# Patient Record
Sex: Female | Born: 1935 | Race: White | Hispanic: No | Smoking: Current every day smoker
Health system: Southern US, Community
[De-identification: ages and names within clinical notes are randomized; demographics above are authoritative.]

## PROBLEM LIST (undated history)

## (undated) DIAGNOSIS — E785 Hyperlipidemia, unspecified: Secondary | ICD-10-CM

## (undated) DIAGNOSIS — J449 Chronic obstructive pulmonary disease, unspecified: Secondary | ICD-10-CM

## (undated) DIAGNOSIS — J45909 Unspecified asthma, uncomplicated: Secondary | ICD-10-CM

## (undated) DIAGNOSIS — I10 Essential (primary) hypertension: Secondary | ICD-10-CM

## (undated) HISTORY — PX: ABDOMINAL HYSTERECTOMY: SHX81

---

## 2000-09-09 ENCOUNTER — Encounter: Admission: RE | Admit: 2000-09-09 | Discharge: 2000-09-09 | Payer: Self-pay | Admitting: Emergency Medicine

## 2000-09-10 ENCOUNTER — Ambulatory Visit (HOSPITAL_COMMUNITY): Admission: RE | Admit: 2000-09-10 | Discharge: 2000-09-10 | Payer: Self-pay | Admitting: Emergency Medicine

## 2001-09-02 ENCOUNTER — Encounter: Admission: RE | Admit: 2001-09-02 | Discharge: 2001-09-02 | Payer: Self-pay | Admitting: Emergency Medicine

## 2002-05-19 ENCOUNTER — Inpatient Hospital Stay (HOSPITAL_COMMUNITY): Admission: RE | Admit: 2002-05-19 | Discharge: 2002-05-22 | Payer: Self-pay | Admitting: Neurosurgery

## 2004-01-20 ENCOUNTER — Inpatient Hospital Stay (HOSPITAL_COMMUNITY): Admission: EM | Admit: 2004-01-20 | Discharge: 2004-01-24 | Payer: Self-pay | Admitting: Emergency Medicine

## 2004-01-23 ENCOUNTER — Ambulatory Visit: Payer: Self-pay | Admitting: Physical Medicine & Rehabilitation

## 2011-04-05 DIAGNOSIS — R252 Cramp and spasm: Secondary | ICD-10-CM | POA: Diagnosis not present

## 2011-04-05 DIAGNOSIS — Z23 Encounter for immunization: Secondary | ICD-10-CM | POA: Diagnosis not present

## 2011-04-05 DIAGNOSIS — J449 Chronic obstructive pulmonary disease, unspecified: Secondary | ICD-10-CM | POA: Diagnosis not present

## 2011-04-05 DIAGNOSIS — I10 Essential (primary) hypertension: Secondary | ICD-10-CM | POA: Diagnosis not present

## 2011-04-05 DIAGNOSIS — E782 Mixed hyperlipidemia: Secondary | ICD-10-CM | POA: Diagnosis not present

## 2011-04-05 DIAGNOSIS — F17200 Nicotine dependence, unspecified, uncomplicated: Secondary | ICD-10-CM | POA: Diagnosis not present

## 2011-04-05 DIAGNOSIS — F3342 Major depressive disorder, recurrent, in full remission: Secondary | ICD-10-CM | POA: Diagnosis not present

## 2011-04-05 DIAGNOSIS — E559 Vitamin D deficiency, unspecified: Secondary | ICD-10-CM | POA: Diagnosis not present

## 2011-10-08 DIAGNOSIS — Z79899 Other long term (current) drug therapy: Secondary | ICD-10-CM | POA: Diagnosis not present

## 2011-10-08 DIAGNOSIS — I10 Essential (primary) hypertension: Secondary | ICD-10-CM | POA: Diagnosis not present

## 2011-10-08 DIAGNOSIS — F339 Major depressive disorder, recurrent, unspecified: Secondary | ICD-10-CM | POA: Diagnosis not present

## 2011-10-08 DIAGNOSIS — E782 Mixed hyperlipidemia: Secondary | ICD-10-CM | POA: Diagnosis not present

## 2011-10-08 DIAGNOSIS — E559 Vitamin D deficiency, unspecified: Secondary | ICD-10-CM | POA: Diagnosis not present

## 2011-10-08 DIAGNOSIS — Z23 Encounter for immunization: Secondary | ICD-10-CM | POA: Diagnosis not present

## 2011-10-08 DIAGNOSIS — F17200 Nicotine dependence, unspecified, uncomplicated: Secondary | ICD-10-CM | POA: Diagnosis not present

## 2012-04-06 ENCOUNTER — Other Ambulatory Visit: Payer: Self-pay | Admitting: Family Medicine

## 2012-04-06 DIAGNOSIS — Z13820 Encounter for screening for osteoporosis: Secondary | ICD-10-CM | POA: Diagnosis not present

## 2012-04-06 DIAGNOSIS — J309 Allergic rhinitis, unspecified: Secondary | ICD-10-CM | POA: Diagnosis not present

## 2012-04-06 DIAGNOSIS — J449 Chronic obstructive pulmonary disease, unspecified: Secondary | ICD-10-CM | POA: Diagnosis not present

## 2012-04-06 DIAGNOSIS — Z1231 Encounter for screening mammogram for malignant neoplasm of breast: Secondary | ICD-10-CM

## 2012-04-06 DIAGNOSIS — E559 Vitamin D deficiency, unspecified: Secondary | ICD-10-CM | POA: Diagnosis not present

## 2012-04-06 DIAGNOSIS — E782 Mixed hyperlipidemia: Secondary | ICD-10-CM | POA: Diagnosis not present

## 2012-04-06 DIAGNOSIS — F339 Major depressive disorder, recurrent, unspecified: Secondary | ICD-10-CM | POA: Diagnosis not present

## 2012-04-06 DIAGNOSIS — I10 Essential (primary) hypertension: Secondary | ICD-10-CM | POA: Diagnosis not present

## 2012-04-06 DIAGNOSIS — Z1239 Encounter for other screening for malignant neoplasm of breast: Secondary | ICD-10-CM | POA: Diagnosis not present

## 2012-05-21 ENCOUNTER — Ambulatory Visit
Admission: RE | Admit: 2012-05-21 | Discharge: 2012-05-21 | Disposition: A | Discharge status: Home / Self Care | Payer: Medicare Other | Source: Ambulatory Visit | Attending: Family Medicine | Admitting: Family Medicine

## 2012-05-21 ENCOUNTER — Other Ambulatory Visit: Payer: Self-pay | Admitting: Family Medicine

## 2012-05-21 DIAGNOSIS — Z1231 Encounter for screening mammogram for malignant neoplasm of breast: Secondary | ICD-10-CM

## 2012-05-21 DIAGNOSIS — R928 Other abnormal and inconclusive findings on diagnostic imaging of breast: Secondary | ICD-10-CM

## 2012-06-04 ENCOUNTER — Ambulatory Visit
Admission: RE | Admit: 2012-06-04 | Discharge: 2012-06-04 | Disposition: A | Discharge status: Home / Self Care | Payer: Medicare Other | Source: Ambulatory Visit | Attending: Family Medicine | Admitting: Family Medicine

## 2012-06-04 DIAGNOSIS — R928 Other abnormal and inconclusive findings on diagnostic imaging of breast: Secondary | ICD-10-CM

## 2012-10-08 DIAGNOSIS — I10 Essential (primary) hypertension: Secondary | ICD-10-CM | POA: Diagnosis not present

## 2012-10-08 DIAGNOSIS — J309 Allergic rhinitis, unspecified: Secondary | ICD-10-CM | POA: Diagnosis not present

## 2012-10-08 DIAGNOSIS — M839 Adult osteomalacia, unspecified: Secondary | ICD-10-CM | POA: Diagnosis not present

## 2012-10-08 DIAGNOSIS — E782 Mixed hyperlipidemia: Secondary | ICD-10-CM | POA: Diagnosis not present

## 2012-10-08 DIAGNOSIS — Z23 Encounter for immunization: Secondary | ICD-10-CM | POA: Diagnosis not present

## 2012-10-08 DIAGNOSIS — F17200 Nicotine dependence, unspecified, uncomplicated: Secondary | ICD-10-CM | POA: Diagnosis not present

## 2012-10-08 DIAGNOSIS — F339 Major depressive disorder, recurrent, unspecified: Secondary | ICD-10-CM | POA: Diagnosis not present

## 2012-10-08 DIAGNOSIS — F064 Anxiety disorder due to known physiological condition: Secondary | ICD-10-CM | POA: Diagnosis not present

## 2012-12-29 DIAGNOSIS — L02519 Cutaneous abscess of unspecified hand: Secondary | ICD-10-CM | POA: Diagnosis not present

## 2013-01-01 DIAGNOSIS — L02519 Cutaneous abscess of unspecified hand: Secondary | ICD-10-CM | POA: Diagnosis not present

## 2013-04-23 DIAGNOSIS — I10 Essential (primary) hypertension: Secondary | ICD-10-CM | POA: Diagnosis not present

## 2013-04-23 DIAGNOSIS — Z23 Encounter for immunization: Secondary | ICD-10-CM | POA: Diagnosis not present

## 2013-04-23 DIAGNOSIS — F3342 Major depressive disorder, recurrent, in full remission: Secondary | ICD-10-CM | POA: Diagnosis not present

## 2013-04-23 DIAGNOSIS — E782 Mixed hyperlipidemia: Secondary | ICD-10-CM | POA: Diagnosis not present

## 2013-04-23 DIAGNOSIS — E559 Vitamin D deficiency, unspecified: Secondary | ICD-10-CM | POA: Diagnosis not present

## 2013-04-23 DIAGNOSIS — F064 Anxiety disorder due to known physiological condition: Secondary | ICD-10-CM | POA: Diagnosis not present

## 2013-10-25 DIAGNOSIS — J309 Allergic rhinitis, unspecified: Secondary | ICD-10-CM | POA: Diagnosis not present

## 2013-10-25 DIAGNOSIS — E782 Mixed hyperlipidemia: Secondary | ICD-10-CM | POA: Diagnosis not present

## 2013-10-25 DIAGNOSIS — F339 Major depressive disorder, recurrent, unspecified: Secondary | ICD-10-CM | POA: Diagnosis not present

## 2013-10-25 DIAGNOSIS — Z23 Encounter for immunization: Secondary | ICD-10-CM | POA: Diagnosis not present

## 2013-10-25 DIAGNOSIS — F17200 Nicotine dependence, unspecified, uncomplicated: Secondary | ICD-10-CM | POA: Diagnosis not present

## 2013-10-25 DIAGNOSIS — E559 Vitamin D deficiency, unspecified: Secondary | ICD-10-CM | POA: Diagnosis not present

## 2013-10-25 DIAGNOSIS — I10 Essential (primary) hypertension: Secondary | ICD-10-CM | POA: Diagnosis not present

## 2014-04-26 DIAGNOSIS — E782 Mixed hyperlipidemia: Secondary | ICD-10-CM | POA: Diagnosis not present

## 2014-04-26 DIAGNOSIS — I10 Essential (primary) hypertension: Secondary | ICD-10-CM | POA: Diagnosis not present

## 2014-04-26 DIAGNOSIS — E559 Vitamin D deficiency, unspecified: Secondary | ICD-10-CM | POA: Diagnosis not present

## 2014-07-17 ENCOUNTER — Encounter (HOSPITAL_COMMUNITY): Payer: Self-pay | Admitting: Radiology

## 2014-07-17 ENCOUNTER — Observation Stay (HOSPITAL_COMMUNITY)
Admission: EM | Admit: 2014-07-17 | Discharge: 2014-07-18 | Disposition: A | Discharge status: Home / Self Care | Payer: Medicare Other | Attending: Internal Medicine | Admitting: Internal Medicine

## 2014-07-17 ENCOUNTER — Emergency Department (HOSPITAL_COMMUNITY): Payer: Medicare Other

## 2014-07-17 DIAGNOSIS — Z9841 Cataract extraction status, right eye: Secondary | ICD-10-CM | POA: Insufficient documentation

## 2014-07-17 DIAGNOSIS — E785 Hyperlipidemia, unspecified: Secondary | ICD-10-CM | POA: Insufficient documentation

## 2014-07-17 DIAGNOSIS — Y9389 Activity, other specified: Secondary | ICD-10-CM | POA: Insufficient documentation

## 2014-07-17 DIAGNOSIS — M5030 Other cervical disc degeneration, unspecified cervical region: Secondary | ICD-10-CM | POA: Insufficient documentation

## 2014-07-17 DIAGNOSIS — Y9289 Other specified places as the place of occurrence of the external cause: Secondary | ICD-10-CM | POA: Diagnosis not present

## 2014-07-17 DIAGNOSIS — R9431 Abnormal electrocardiogram [ECG] [EKG]: Secondary | ICD-10-CM | POA: Diagnosis not present

## 2014-07-17 DIAGNOSIS — I6523 Occlusion and stenosis of bilateral carotid arteries: Secondary | ICD-10-CM | POA: Diagnosis not present

## 2014-07-17 DIAGNOSIS — Y998 Other external cause status: Secondary | ICD-10-CM | POA: Diagnosis not present

## 2014-07-17 DIAGNOSIS — I10 Essential (primary) hypertension: Secondary | ICD-10-CM | POA: Insufficient documentation

## 2014-07-17 DIAGNOSIS — R739 Hyperglycemia, unspecified: Secondary | ICD-10-CM

## 2014-07-17 DIAGNOSIS — S0081XA Abrasion of other part of head, initial encounter: Secondary | ICD-10-CM | POA: Diagnosis not present

## 2014-07-17 DIAGNOSIS — T1490XA Injury, unspecified, initial encounter: Secondary | ICD-10-CM

## 2014-07-17 DIAGNOSIS — R55 Syncope and collapse: Principal | ICD-10-CM | POA: Insufficient documentation

## 2014-07-17 DIAGNOSIS — S0180XA Unspecified open wound of other part of head, initial encounter: Secondary | ICD-10-CM | POA: Diagnosis not present

## 2014-07-17 DIAGNOSIS — S0990XA Unspecified injury of head, initial encounter: Secondary | ICD-10-CM | POA: Diagnosis not present

## 2014-07-17 DIAGNOSIS — E860 Dehydration: Secondary | ICD-10-CM | POA: Insufficient documentation

## 2014-07-17 DIAGNOSIS — W19XXXA Unspecified fall, initial encounter: Secondary | ICD-10-CM | POA: Insufficient documentation

## 2014-07-17 DIAGNOSIS — S0101XA Laceration without foreign body of scalp, initial encounter: Secondary | ICD-10-CM | POA: Diagnosis not present

## 2014-07-17 DIAGNOSIS — S199XXA Unspecified injury of neck, initial encounter: Secondary | ICD-10-CM | POA: Diagnosis not present

## 2014-07-17 DIAGNOSIS — F17210 Nicotine dependence, cigarettes, uncomplicated: Secondary | ICD-10-CM | POA: Insufficient documentation

## 2014-07-17 DIAGNOSIS — Z9842 Cataract extraction status, left eye: Secondary | ICD-10-CM | POA: Insufficient documentation

## 2014-07-17 DIAGNOSIS — M4802 Spinal stenosis, cervical region: Secondary | ICD-10-CM | POA: Diagnosis not present

## 2014-07-17 HISTORY — DX: Essential (primary) hypertension: I10

## 2014-07-17 HISTORY — DX: Hyperlipidemia, unspecified: E78.5

## 2014-07-17 LAB — TROPONIN I: Troponin I: <0.03 ng/mL (ref <0.031)

## 2014-07-17 LAB — I-STAT CHEM 8, ED
BUN: 28 mg/dL — AB (ref 6–20)
Calcium, Ion: 1.15 mmol/L (ref 1.13–1.30)
Chloride: 101 mmol/L (ref 101–111)
Creatinine, Ser: 1 mg/dL (ref 0.44–1.00)
Glucose, Bld: 117 mg/dL — ABNORMAL HIGH (ref 65–99)
HCT: 39 % (ref 36.0–46.0)
Hemoglobin: 13.3 g/dL (ref 12.0–15.0)
Potassium: 4 mmol/L (ref 3.5–5.1)
SODIUM: 137 mmol/L (ref 135–145)
TCO2: 24 mmol/L (ref 0–100)

## 2014-07-17 LAB — CBC
HCT: 37.7 % (ref 36.0–46.0)
Hemoglobin: 12 g/dL (ref 12.0–15.0)
MCH: 29.5 pg (ref 26.0–34.0)
MCHC: 31.8 g/dL (ref 30.0–36.0)
MCV: 92.6 fL (ref 78.0–100.0)
Platelets: 288 10*3/uL (ref 150–400)
RBC: 4.07 MIL/uL (ref 3.87–5.11)
RDW: 14.2 % (ref 11.5–15.5)
WBC: 7.2 10*3/uL (ref 4.0–10.5)

## 2014-07-17 LAB — I-STAT TROPONIN, ED: Troponin i, poc: 0 ng/mL (ref 0.00–0.08)

## 2014-07-17 LAB — TSH: TSH: 1.724 u[IU]/mL (ref 0.350–4.500)

## 2014-07-17 MED ORDER — SODIUM CHLORIDE 0.9 % IJ SOLN
3.0000 mL | Freq: Two times a day (BID) | INTRAMUSCULAR | Status: DC
  Administered 2014-07-17: 3 mL via INTRAVENOUS

## 2014-07-17 MED ORDER — SIMVASTATIN 20 MG PO TABS
20.0000 mg | ORAL_TABLET | Freq: Every day | ORAL | Status: DC
  Administered 2014-07-17 – 2014-07-18 (×2): 20 mg via ORAL
  Filled 2014-07-17 (×2): qty 1

## 2014-07-17 MED ORDER — ONDANSETRON HCL 4 MG/2ML IJ SOLN: 4.0000 mg | Freq: Three times a day (TID) | INTRAMUSCULAR | Status: AC | PRN

## 2014-07-17 MED ORDER — VITAMIN D 1000 UNITS PO TABS
1000.0000 [IU] | ORAL_TABLET | Freq: Every day | ORAL | Status: DC
  Administered 2014-07-18: 1000 [IU] via ORAL
  Filled 2014-07-17: qty 1

## 2014-07-17 MED ORDER — SODIUM CHLORIDE 0.9 % IV SOLN
INTRAVENOUS | Status: AC
  Administered 2014-07-17: 22:00:00 via INTRAVENOUS

## 2014-07-17 MED ORDER — MIRTAZAPINE 15 MG PO TBDP
15.0000 mg | ORAL_TABLET | Freq: Every day | ORAL | Status: DC
  Administered 2014-07-17: 15 mg via ORAL
  Filled 2014-07-17 (×2): qty 1

## 2014-07-17 MED ORDER — MAGNESIUM 200 MG PO TABS
1.0000 | ORAL_TABLET | Freq: Every day | ORAL | Status: DC
  Administered 2014-07-17: 200 mg via ORAL
  Filled 2014-07-17 (×3): qty 1

## 2014-07-17 MED ORDER — ACETAMINOPHEN 325 MG PO TABS: 650.0000 mg | ORAL_TABLET | Freq: Four times a day (QID) | ORAL | Status: DC | PRN

## 2014-07-17 MED ORDER — TAB-A-VITE/IRON PO TABS
1.0000 | ORAL_TABLET | Freq: Every day | ORAL | Status: DC
  Administered 2014-07-18: 1 via ORAL
  Filled 2014-07-17 (×2): qty 1

## 2014-07-17 MED ORDER — LISINOPRIL 10 MG PO TABS: 10.0000 mg | ORAL_TABLET | Freq: Every day | ORAL | Status: DC

## 2014-07-17 MED ORDER — ACETAMINOPHEN 650 MG RE SUPP: 650.0000 mg | Freq: Four times a day (QID) | RECTAL | Status: DC | PRN

## 2014-07-17 NOTE — ED Notes (Signed)
Bed: [facility] Expected date:  Expected time:  Means of arrival:  Comments: EMS fall 

## 2014-07-17 NOTE — H&P (Signed)
Ashlee Cobb is an 81 y.o. female.    Orie Rout (pcp, Tamela Oddi)  Chief Complaint: syncope HPI: 81 yo female with htn, hyperlipidemia, apparently was apparently just finishing brushing her teeth and had syncope.  No presyncopal symptoms.  Out 2-3 minutes.  Pt had history of black out, and used to take medication for it but last was in 2006.  Pt denies cp, palp, sob, n/v, focal neurological symptoms, seizure.  Pt was seen in ED and to be admitted for syncope.   Past Medical History  Diagnosis Date  . Hyperlipidemia   . Hypertension     Past Surgical History  Procedure Laterality Date  . Abdominal hysterectomy      Family History  Problem Relation Age of Onset  . Cirrhosis Father    Social History:  reports that she has been smoking Cigarettes.  She has a 60 pack-year smoking history. She does not have any smokeless tobacco history on file. She reports that she drinks about 0.6 oz of alcohol per week. Her drug history is not on file.  Allergies: No Known Allergies Medications reviewed   Results for orders placed or performed during the hospital encounter of 07/17/14 (from the past 48 hour(s))  CBC     Status: None   Collection Time: 07/17/14  5:52 PM  Result Value Ref Range   WBC 7.2 4.0 - 10.5 K/uL   RBC 4.07 3.87 - 5.11 MIL/uL   Hemoglobin 12.0 12.0 - 15.0 g/dL   HCT 79.3 90.3 - 00.9 %   MCV 92.6 78.0 - 100.0 fL   MCH 29.5 26.0 - 34.0 pg   MCHC 31.8 30.0 - 36.0 g/dL   RDW 23.3 00.7 - 62.2 %   Platelets 288 150 - 400 K/uL  I-stat chem 8, ed     Status: Abnormal   Collection Time: 07/17/14  5:58 PM  Result Value Ref Range   Sodium 137 135 - 145 mmol/L   Potassium 4.0 3.5 - 5.1 mmol/L   Chloride 101 101 - 111 mmol/L   BUN 28 (H) 6 - 20 mg/dL   Creatinine, Ser 6.33 0.44 - 1.00 mg/dL   Glucose, Bld 354 (H) 65 - 99 mg/dL   Calcium, Ion 5.62 5.63 - 1.30 mmol/L   TCO2 24 0 - 100 mmol/L   Hemoglobin 13.3 12.0 - 15.0 g/dL   HCT 89.3 73.4 - 28.7 %  I-stat  troponin, ED     Status: None   Collection Time: 07/17/14  6:16 PM  Result Value Ref Range   Troponin i, poc 0.00 0.00 - 0.08 ng/mL   Comment 3            Comment: Due to the release kinetics of cTnI, a negative result within the first hours of the onset of symptoms does not rule out myocardial infarction with certainty. If myocardial infarction is still suspected, repeat the test at appropriate intervals.    Ct Head Wo Contrast  07/17/2014   ADDENDUM REPORT: 07/17/2014 18:46  ADDENDUM: Left parietal scalp soft tissue injury.   Electronically Signed   By: Sebastian Ache   On: 07/17/2014 18:46   07/17/2014   CLINICAL DATA:  Fall today with abrasion on the back of the head. No loss of consciousness. Initial encounter.  EXAM: CT HEAD WITHOUT CONTRAST  CT CERVICAL SPINE WITHOUT CONTRAST  TECHNIQUE: Multidetector CT imaging of the head and cervical spine was performed following the standard protocol without intravenous contrast. Multiplanar CT image reconstructions  of the cervical spine were also generated.  COMPARISON:  None.  FINDINGS: CT HEAD FINDINGS  Mild generalized cerebral atrophy is within normal limits for age. Periventricular white-matter hypodensities are nonspecific but compatible with mild chronic small vessel ischemic disease. There is no evidence of acute cortical infarct, intracranial hemorrhage, mass, midline shift, or extra-axial fluid collection.  Prior bilateral cataract extraction is noted. The there is focal scalp soft tissue swelling with a small locular gas noted in the left parietal scalp. The mastoid air cells and paranasal sinuses are clear. No skull fracture is identified.  CT CERVICAL SPINE FINDINGS  There is slight anterolisthesis of C4 on C5, likely facet mediated. Mild, focal deformity of the anterior superior C5 vertebral body does not appear acute, possibly reflecting remote injury. No acute cervical spine fracture is identified.  Moderate disc space narrowing and  degenerative endplate spurring are present at C5-6 and C6-7. Partially calcified disc protrusions at C5-6 and C6-7 result in likely moderate spinal stenosis at both levels. Asymmetric, advanced facet arthrosis is present on the right at C3-4 and C4-5 resulting in moderate right-sided neural foraminal stenosis. Uncovertebral spurring results in moderate to severe neural foraminal stenosis bilaterally at C5-6 and C6-7. Mild scarring is noted in the lung apices. Advanced atherosclerotic vascular calcification is noted.  IMPRESSION: 1. No evidence of acute intracranial abnormality. 2. No acute osseous abnormality identified in the cervical spine. 3. Cervical disc degeneration, greatest at C5-6 and C6-7 where there is moderate spinal stenosis and bilateral neural foraminal stenosis.  Electronically Signed: By: Sebastian Ache On: 07/17/2014 18:38   Ct Cervical Spine Wo Contrast  07/17/2014   ADDENDUM REPORT: 07/17/2014 18:46  ADDENDUM: Left parietal scalp soft tissue injury.   Electronically Signed   By: Sebastian Ache   On: 07/17/2014 18:46   07/17/2014   CLINICAL DATA:  Fall today with abrasion on the back of the head. No loss of consciousness. Initial encounter.  EXAM: CT HEAD WITHOUT CONTRAST  CT CERVICAL SPINE WITHOUT CONTRAST  TECHNIQUE: Multidetector CT imaging of the head and cervical spine was performed following the standard protocol without intravenous contrast. Multiplanar CT image reconstructions of the cervical spine were also generated.  COMPARISON:  None.  FINDINGS: CT HEAD FINDINGS  Mild generalized cerebral atrophy is within normal limits for age. Periventricular white-matter hypodensities are nonspecific but compatible with mild chronic small vessel ischemic disease. There is no evidence of acute cortical infarct, intracranial hemorrhage, mass, midline shift, or extra-axial fluid collection.  Prior bilateral cataract extraction is noted. The there is focal scalp soft tissue swelling with a small locular  gas noted in the left parietal scalp. The mastoid air cells and paranasal sinuses are clear. No skull fracture is identified.  CT CERVICAL SPINE FINDINGS  There is slight anterolisthesis of C4 on C5, likely facet mediated. Mild, focal deformity of the anterior superior C5 vertebral body does not appear acute, possibly reflecting remote injury. No acute cervical spine fracture is identified.  Moderate disc space narrowing and degenerative endplate spurring are present at C5-6 and C6-7. Partially calcified disc protrusions at C5-6 and C6-7 result in likely moderate spinal stenosis at both levels. Asymmetric, advanced facet arthrosis is present on the right at C3-4 and C4-5 resulting in moderate right-sided neural foraminal stenosis. Uncovertebral spurring results in moderate to severe neural foraminal stenosis bilaterally at C5-6 and C6-7. Mild scarring is noted in the lung apices. Advanced atherosclerotic vascular calcification is noted.  IMPRESSION: 1. No evidence of acute intracranial abnormality.  2. No acute osseous abnormality identified in the cervical spine. 3. Cervical disc degeneration, greatest at C5-6 and C6-7 where there is moderate spinal stenosis and bilateral neural foraminal stenosis.  Electronically Signed: By: Sebastian Ache On: 07/17/2014 18:38    Review of Systems  Constitutional: Negative.   HENT: Negative.   Eyes: Negative.   Respiratory: Negative.   Cardiovascular: Negative.   Gastrointestinal: Negative.   Genitourinary: Negative.   Musculoskeletal: Negative.   Skin: Negative.   Neurological: Positive for loss of consciousness. Negative for dizziness, tingling, tremors, sensory change, speech change, focal weakness and seizures.  Endo/Heme/Allergies: Negative.   Psychiatric/Behavioral: Negative.     Blood pressure 102/65, pulse 96, temperature 98.1 F (36.7 C), temperature source Oral, resp. rate 18, SpO2 97 %. Physical Exam  Constitutional: She is oriented to person, place,  and time. She appears well-developed and well-nourished.  HENT:  Head: Normocephalic and atraumatic.  Eyes: Conjunctivae and EOM are normal. Pupils are equal, round, and reactive to light. No scleral icterus.  Neck: Normal range of motion. Neck supple. No JVD present. No tracheal deviation present. No thyromegaly present.  Cardiovascular: Normal rate and regular rhythm.  Exam reveals no gallop and no friction rub.   No murmur heard. Respiratory: Effort normal and breath sounds normal. No respiratory distress. She has no wheezes. She has no rales.  GI: Soft. Bowel sounds are normal. She exhibits no distension. There is no tenderness. There is no rebound and no guarding.  Musculoskeletal: Normal range of motion. She exhibits no edema or tenderness.  Lymphadenopathy:    She has no cervical adenopathy.  Neurological: She is alert and oriented to person, place, and time. She has normal reflexes. She displays normal reflexes. No cranial nerve deficit. She exhibits normal muscle tone. Coordination normal.  Skin: Skin is warm and dry. No rash noted. No erythema. No pallor.  Psychiatric: She has a normal mood and affect. Her behavior is normal. Judgment and thought content normal.     Assessment/Plan Syncope Tele Trop i q6h x3 Check tsh Check orthostatic bp Check carotid u/s cardiac echo Please expand database in the am to find out what medication she was on in the past for syncope  Hyperglycemia Check hga1c  Dehydration Hydration with saline  Laceration of the scalp,  Pt will need to have staples removed in 7-10 days.   DVT prophylaxis:  Scd Check cbc, cmp in am   Pearson Grippe 07/17/2014, 7:39 PM

## 2014-07-17 NOTE — Progress Notes (Signed)
Patient with laceration on scalp. Patient still with minimal bleeding from laceration. Patient denies headache or pain, just soreness. Will continue to monitor patient closely.

## 2014-07-17 NOTE — ED Provider Notes (Signed)
CSN: [id number]     Arrival date & time 07/17/14  1711 History   First MD Initiated Contact with Patient 07/17/14 1726     Chief Complaint  Patient presents with  . Fall  . possible syncopal episode      (Consider location/radiation/quality/duration/timing/severity/associated sxs/prior Treatment) HPI Comments: The patient is an 81 year old female, she currently lives at [facility] where she was in her bathroom, she denies any prodromal symptoms including chest pain, palpitations, shortness of breath and she denies any fevers chills nausea vomiting diarrhea dysuria swelling rashes weakness numbness or change in vision. She does not have a headache, she awoke on the floor of her bathroom after she was trying to brush her teeth, she had a small laceration, she was able to get up and walk, she pulled the cord for assistance, she then sat in a chair and waited for help. At this time she feels she is at her baseline, she denies taking any blood thinners, she denies having syncopal episodes in the past. At this time she feels fine and has no complaints.  This was acute in onset, it occurred just prior to arrival, paramedics transported the patient, no abnormal vital signs. She does state that she was able to eat both breakfast and lunch today without difficulty.  Patient is a 81 y.o. female presenting with fall. The history is provided by the patient.  Fall    History reviewed. No pertinent past medical history. No past surgical history on file. No family history on file. History  Substance Use Topics  . Smoking status: Not on file  . Smokeless tobacco: Not on file  . Alcohol Use: Not on file   OB History    No data available     Review of Systems  All other systems reviewed and are negative.     Allergies  Review of patient's allergies indicates not on file.  Home Medications   Prior to Admission medications   Medication Sig Start Date End Date Taking?  Authorizing Provider  cholecalciferol (VITAMIN D) 1000 UNITS tablet Take 1,000 Units by mouth daily.   Yes Historical Provider, MD  fexofenadine (ALLEGRA) 180 MG tablet Take 180 mg by mouth daily as needed for allergies or rhinitis.   Yes Historical Provider, MD  lisinopril (PRINIVIL,ZESTRIL) 10 MG tablet Take 10 mg by mouth daily.   Yes Historical Provider, MD  Magnesium 250 MG TABS Take 1 tablet by mouth daily at 8 pm. At 5 pm to help with night time cramps   Yes Historical Provider, MD  mirtazapine (REMERON SOL-TAB) 15 MG disintegrating tablet DISSOLVE 1 TAB BY MOUTH DAILY AT 8PM 06/08/14  Yes Historical Provider, MD  Multiple Vitamins-Iron (MULTIVITAMINS WITH IRON) TABS tablet Take 1 tablet by mouth daily.   Yes Historical Provider, MD  simvastatin (ZOCOR) 20 MG tablet Take 20 mg by mouth daily.   Yes Historical Provider, MD   BP 102/65 mmHg  Pulse 96  Temp(Src) 98.1 F (36.7 C) (Oral)  Resp 18  SpO2 97% Physical Exam  Constitutional: She appears well-developed and well-nourished. No distress.  HENT:  Head: Normocephalic.  Mouth/Throat: Oropharynx is clear and moist. No oropharyngeal exudate.  No hemotympanum, no raccoon eyes, no battle sign, no malocclusion, there is a 2 cm laceration to the crown of the head just left of midline  Eyes: Conjunctivae and EOM are normal. Pupils are equal, round, and reactive to light. Right eye exhibits no discharge. Left eye exhibits no discharge. No  scleral icterus.  Neck: Normal range of motion. Neck supple. No JVD present. No thyromegaly present.  No tenderness over the cervical spine  Cardiovascular: Normal rate, regular rhythm, normal heart sounds and intact distal pulses.  Exam reveals no gallop and no friction rub.   No murmur heard. Pulmonary/Chest: Effort normal and breath sounds normal. No respiratory distress. She has no wheezes. She has no rales.  Abdominal: Soft. Bowel sounds are normal. She exhibits no distension and no mass. There is no  tenderness.  Musculoskeletal: Normal range of motion. She exhibits no edema or tenderness.  Lymphadenopathy:    She has no cervical adenopathy.  Neurological: She is alert. Coordination normal.  Normal speech and coordination, normal strength all 4 extremities, able to straight leg raise bilaterally without difficulty.  Skin: Skin is warm and dry. No rash noted. No erythema.  Psychiatric: She has a normal mood and affect. Her behavior is normal.  Nursing note and vitals reviewed.   ED Course  Procedures (including critical care time) Labs Review Labs Reviewed  I-STAT CHEM 8, ED - Abnormal; Notable for the following:    BUN 28 (*)    Glucose, Bld 117 (*)    All other components within normal limits  CBC  I-STAT TROPOININ, ED    Imaging Review Ct Head Wo Contrast  07/17/2014   ADDENDUM REPORT: 07/17/2014 18:46  ADDENDUM: Left parietal scalp soft tissue injury.   Electronically Signed   By: Sebastian Ache   On: 07/17/2014 18:46   07/17/2014   CLINICAL DATA:  Fall today with abrasion on the back of the head. No loss of consciousness. Initial encounter.  EXAM: CT HEAD WITHOUT CONTRAST  CT CERVICAL SPINE WITHOUT CONTRAST  TECHNIQUE: Multidetector CT imaging of the head and cervical spine was performed following the standard protocol without intravenous contrast. Multiplanar CT image reconstructions of the cervical spine were also generated.  COMPARISON:  None.  FINDINGS: CT HEAD FINDINGS  Mild generalized cerebral atrophy is within normal limits for age. Periventricular white-matter hypodensities are nonspecific but compatible with mild chronic small vessel ischemic disease. There is no evidence of acute cortical infarct, intracranial hemorrhage, mass, midline shift, or extra-axial fluid collection.  Prior bilateral cataract extraction is noted. The there is focal scalp soft tissue swelling with a small locular gas noted in the left parietal scalp. The mastoid air cells and paranasal sinuses are  clear. No skull fracture is identified.  CT CERVICAL SPINE FINDINGS  There is slight anterolisthesis of C4 on C5, likely facet mediated. Mild, focal deformity of the anterior superior C5 vertebral body does not appear acute, possibly reflecting remote injury. No acute cervical spine fracture is identified.  Moderate disc space narrowing and degenerative endplate spurring are present at C5-6 and C6-7. Partially calcified disc protrusions at C5-6 and C6-7 result in likely moderate spinal stenosis at both levels. Asymmetric, advanced facet arthrosis is present on the right at C3-4 and C4-5 resulting in moderate right-sided neural foraminal stenosis. Uncovertebral spurring results in moderate to severe neural foraminal stenosis bilaterally at C5-6 and C6-7. Mild scarring is noted in the lung apices. Advanced atherosclerotic vascular calcification is noted.  IMPRESSION: 1. No evidence of acute intracranial abnormality. 2. No acute osseous abnormality identified in the cervical spine. 3. Cervical disc degeneration, greatest at C5-6 and C6-7 where there is moderate spinal stenosis and bilateral neural foraminal stenosis.  Electronically Signed: By: Sebastian Ache On: 07/17/2014 18:38   Ct Cervical Spine Wo Contrast  07/17/2014  ADDENDUM REPORT: 07/17/2014 18:46  ADDENDUM: Left parietal scalp soft tissue injury.   Electronically Signed   By: Sebastian Ache   On: 07/17/2014 18:46   07/17/2014   CLINICAL DATA:  Fall today with abrasion on the back of the head. No loss of consciousness. Initial encounter.  EXAM: CT HEAD WITHOUT CONTRAST  CT CERVICAL SPINE WITHOUT CONTRAST  TECHNIQUE: Multidetector CT imaging of the head and cervical spine was performed following the standard protocol without intravenous contrast. Multiplanar CT image reconstructions of the cervical spine were also generated.  COMPARISON:  None.  FINDINGS: CT HEAD FINDINGS  Mild generalized cerebral atrophy is within normal limits for age. Periventricular  white-matter hypodensities are nonspecific but compatible with mild chronic small vessel ischemic disease. There is no evidence of acute cortical infarct, intracranial hemorrhage, mass, midline shift, or extra-axial fluid collection.  Prior bilateral cataract extraction is noted. The there is focal scalp soft tissue swelling with a small locular gas noted in the left parietal scalp. The mastoid air cells and paranasal sinuses are clear. No skull fracture is identified.  CT CERVICAL SPINE FINDINGS  There is slight anterolisthesis of C4 on C5, likely facet mediated. Mild, focal deformity of the anterior superior C5 vertebral body does not appear acute, possibly reflecting remote injury. No acute cervical spine fracture is identified.  Moderate disc space narrowing and degenerative endplate spurring are present at C5-6 and C6-7. Partially calcified disc protrusions at C5-6 and C6-7 result in likely moderate spinal stenosis at both levels. Asymmetric, advanced facet arthrosis is present on the right at C3-4 and C4-5 resulting in moderate right-sided neural foraminal stenosis. Uncovertebral spurring results in moderate to severe neural foraminal stenosis bilaterally at C5-6 and C6-7. Mild scarring is noted in the lung apices. Advanced atherosclerotic vascular calcification is noted.  IMPRESSION: 1. No evidence of acute intracranial abnormality. 2. No acute osseous abnormality identified in the cervical spine. 3. Cervical disc degeneration, greatest at C5-6 and C6-7 where there is moderate spinal stenosis and bilateral neural foraminal stenosis.  Electronically Signed: By: Sebastian Ache On: 07/17/2014 18:38     EKG Interpretation   Date/Time:  Sunday July 17 2014 17:47:17 EDT Ventricular Rate:  96 PR Interval:  202 QRS Duration: 81 QT Interval:  365 QTC Calculation: 461 R Axis:   68 Text Interpretation:  Sinus rhythm Ventricular premature complex Minimal  ST depression, diffuse leads Since last tracing ST  depressions are new  Abnormal ekg Confirmed by Braycen Burandt  MD, Ryliee Figge ([id number]) on 07/17/2014 6:02:42  PM      MDM   Final diagnoses:  Trauma  Laceration of scalp, initial encounter  Syncope, unspecified syncope type  Abnormal ECG    Vital signs are normal, she has a slightly low blood pressure, we'll check orthostatic vital signs, EKG, rule out anemia, [place] repair scalp.  CT neg for acute findings,  Lac repaired with staples No orthostatic changes ECG changes noted Labs without acute findings - will admit for observation.  LACERATION REPAIR Performed by: Vida Roller Authorized by: Vida Roller Consent: Verbal consent obtained. Risks and benefits: risks, benefits and alternatives were discussed Consent given by: patient Patient identity confirmed: provided demographic data Prepped and Draped in normal sterile fashion Wound explored  Laceration Location: Scalp  Laceration Length: 2 cm  No Foreign Bodies seen or palpated  Anesthesia: local infiltration  Local anesthetic: none  Irrigation method: syringe - 2 L of sterile saline Amount of cleaning: standard  Skin closure: staples  Number of  sutures: 2  Technique: Staplies.  Patient tolerance: Patient tolerated the procedure well with no immediate complications.   Due to the syncope with the abnormal EKG she will need to be admitted to the hospital for observation. We'll discuss with the hospitalist  Discussed with Dr. Selena Batten, he will see the patient emergency department as consultation for admission to the hospital.  Eber Hong, MD 07/17/14 1905

## 2014-07-18 ENCOUNTER — Observation Stay (HOSPITAL_COMMUNITY): Payer: Medicare Other

## 2014-07-18 ENCOUNTER — Observation Stay (HOSPITAL_BASED_OUTPATIENT_CLINIC_OR_DEPARTMENT_OTHER): Payer: Medicare Other

## 2014-07-18 DIAGNOSIS — R55 Syncope and collapse: Secondary | ICD-10-CM

## 2014-07-18 LAB — COMPREHENSIVE METABOLIC PANEL
ALBUMIN: 3.4 g/dL — AB (ref 3.5–5.0)
ALT: 14 U/L (ref 14–54)
ANION GAP: 7 (ref 5–15)
AST: 23 U/L (ref 15–41)
Alkaline Phosphatase: 52 U/L (ref 38–126)
BUN: 18 mg/dL (ref 6–20)
CALCIUM: 8.7 mg/dL — AB (ref 8.9–10.3)
CHLORIDE: 108 mmol/L (ref 101–111)
CO2: 25 mmol/L (ref 22–32)
CREATININE: 0.82 mg/dL (ref 0.44–1.00)
GFR calc Af Amer: >60 mL/min (ref >60)
GFR calc non Af Amer: >60 mL/min (ref >60)
Glucose, Bld: 105 mg/dL — ABNORMAL HIGH (ref 65–99)
Potassium: 4 mmol/L (ref 3.5–5.1)
Sodium: 140 mmol/L (ref 135–145)
TOTAL PROTEIN: 6.3 g/dL — AB (ref 6.5–8.1)
Total Bilirubin: 0.4 mg/dL (ref 0.3–1.2)

## 2014-07-18 LAB — TROPONIN I: Troponin I: <0.03 ng/mL (ref <0.031)

## 2014-07-18 LAB — CBC
HEMATOCRIT: 34.1 % — AB (ref 36.0–46.0)
Hemoglobin: 11.1 g/dL — ABNORMAL LOW (ref 12.0–15.0)
MCH: 30.1 pg (ref 26.0–34.0)
MCHC: 32.6 g/dL (ref 30.0–36.0)
MCV: 92.4 fL (ref 78.0–100.0)
PLATELETS: 261 10*3/uL (ref 150–400)
RBC: 3.69 MIL/uL — ABNORMAL LOW (ref 3.87–5.11)
RDW: 14.1 % (ref 11.5–15.5)
WBC: 6.8 10*3/uL (ref 4.0–10.5)

## 2014-07-18 LAB — MRSA PCR SCREENING: MRSA by PCR: NEGATIVE

## 2014-07-18 MED ORDER — SODIUM CHLORIDE 0.9 % IV BOLUS (SEPSIS)
500.0000 mL | Freq: Once | INTRAVENOUS | Status: AC
  Administered 2014-07-18: 500 mL via INTRAVENOUS

## 2014-07-18 NOTE — Progress Notes (Signed)
  Echocardiogram 2D Echocardiogram has been performed.  Cathie Beams 07/18/2014, 12:46 PM

## 2014-07-18 NOTE — Progress Notes (Signed)
VASCULAR LAB PRELIMINARY  PRELIMINARY  PRELIMINARY  PRELIMINARY  Carotid duplex completed.    Preliminary report: Bilateral:  1-39% ICA stenosis right greater than left. Vertebral artery flow is retrograde on the right and antegrade on the left. Bilateral ECA stenosis left greater than right.     Mattalynn Crandle, RVS 07/18/2014, 11:28 AM

## 2014-07-18 NOTE — Evaluation (Signed)
Physical Therapy Evaluation Patient Details Name: Ashlee Cobb MRN: [id number] DOB: Jun 22, 1923 Today's Date: 07/18/2014   History of Present Illness  81 yo female admitted with syncope and collapse, scalp laceration. Pt is from an ALF  Clinical Impression  On eval, pt was able to walk ~450 feet with intermittent use of hallway handrail. Unsteady at times. LOB x 1 while walking backwards during balance assessment. Pt denied lightheadedness/dizziness during session. Encouraged pt to have ALF staff assist her with ADLs initially to ensure safety. Also recommended to pt that she use cane while ambulating for improved stability (pt reports she already has RW and cane in her room at facility). Recommend HHPT for general strength and balance training at ALF.     Follow Up Recommendations Home health PT (at ALF)    Equipment Recommendations  None recommended by PT (pt reports having RW and cane in her room. Instructed pt to use cane for now. )    Recommendations for Other Services       Precautions / Restrictions Precautions Precautions: Fall Restrictions Weight Bearing Restrictions: No      Mobility  Bed Mobility Overal bed mobility: Modified Independent                Transfers Overall transfer level: Needs assistance   Transfers: Sit to/from Stand Sit to Stand: Min guard         General transfer comment: close guard for safety  Ambulation/Gait Ambulation/Gait assistance: Min assist Ambulation Distance (Feet): 450 Feet Assistive device: None (intermittent handrail use) Gait Pattern/deviations: Wide base of support;Trunk flexed;Step-through pattern     General Gait Details: intermittent assist needed to stabilize. unsteady at times, especially with backwards steps.   Stairs            Wheelchair Mobility    Modified Rankin (Stroke Patients Only)       Balance Overall balance assessment: History of Falls;Needs assistance         Standing balance  support: No upper extremity supported;During functional activity Standing balance-Leahy Scale: Good Standing balance comment: static standing with EO/EC, narrow BOS, withstanding of external perturbations-Min guard assist. 360 degree turn, picking up an object-Min guard assist. Step to task-1 HHA for pt to complete safely             High level balance activites: Side stepping;Backward walking;Direction changes;Turns;Head turns High Level Balance Comments: noted some difficulty with walking while turning head and LOB with backwards walking             Pertinent Vitals/Pain Pain Assessment: Faces Faces Pain Scale: Hurts a little bit Pain Location: head Pain Descriptors / Indicators: Sore    Home Living Family/patient expects to be discharged to:: Assisted living               Home Equipment: Walker - 2 wheels;Cane - single point      Prior Function Level of Independence: Independent         Comments: per pt, not receiving any assistance however it is available if she needs it     Hand Dominance        Extremity/Trunk Assessment   Upper Extremity Assessment: Overall WFL for tasks assessed           Lower Extremity Assessment: Generalized weakness      Cervical / Trunk Assessment: Normal  Communication   Communication: No difficulties  Cognition Arousal/Alertness: Awake/alert Behavior During Therapy: WFL for tasks assessed/performed Overall Cognitive Status: Within Functional Limits for  tasks assessed                      General Comments      Exercises        Assessment/Plan    PT Assessment All further PT needs can be met in the next venue of care (HHPT at ALF)  PT Diagnosis Difficulty walking   PT Problem List Decreased mobility;Decreased balance  PT Treatment Interventions     PT Goals (Current goals can be found in the Care Plan section) Acute Rehab PT Goals Patient Stated Goal: home soon PT Goal Formulation: With  patient Time For Goal Achievement: 08/01/14 Potential to Achieve Goals: Good    Frequency     Barriers to discharge        Co-evaluation               End of Session Equipment Utilized During Treatment: Gait belt Activity Tolerance: Patient tolerated treatment well Patient left: in chair;with call bell/phone within reach      Functional Assessment Tool Used: clinical judgement Functional Limitation: Mobility: Walking and moving around Mobility: Walking and Moving Around Current Status (L2957): At least 1 percent but less than 20 percent impaired, limited or restricted Mobility: Walking and Moving Around Goal Status [phone]): At least 1 percent but less than 20 percent impaired, limited or restricted Mobility: Walking and Moving Around Discharge Status [phone]): At least 1 percent but less than 20 percent impaired, limited or restricted    Time: 4383-8184 PT Time Calculation (min) (ACUTE ONLY): 24 min   Charges:   PT Evaluation $Initial PT Evaluation Tier I: 1 Procedure PT Treatments $Gait Training: 8-22 mins   PT G Codes:   PT G-Codes **NOT FOR INPATIENT CLASS** Functional Assessment Tool Used: clinical judgement Functional Limitation: Mobility: Walking and moving around Mobility: Walking and Moving Around Current Status (C3754): At least 1 percent but less than 20 percent impaired, limited or restricted Mobility: Walking and Moving Around Goal Status [phone]): At least 1 percent but less than 20 percent impaired, limited or restricted Mobility: Walking and Moving Around Discharge Status [phone]): At least 1 percent but less than 20 percent impaired, limited or restricted    Weston Anna, MPT Pager: [phone]

## 2014-07-18 NOTE — Clinical Social Work Note (Signed)
Clinical Social Work Assessment  Patient Details  Name: Ashlee Cobb MRN: [id number] Date of Birth: [birth year]  Date of referral:  07/18/14               Reason for consult:  Facility Placement                Permission sought to share information with:  Facility Industrial/product designer granted to share information::  Yes, Verbal Permission Granted  Name::        Agency::     Relationship::     Contact Information:     Housing/Transportation Living arrangements for the past 2 months:  Assisted Living Facility Source of Information:  Patient Patient Interpreter Needed:  None Criminal Activity/Legal Involvement Pertinent to Current Situation/Hospitalization:  No - Comment as needed Significant Relationships:  None Lives with:  Facility Resident Do you feel safe going back to the place where you live?  Yes Need for family participation in patient care:  No (Coment)  Care giving concerns:  CSW received consult that patient was admitted from [facility]F (ph#: [phone])   Social Worker assessment / plan:  CSW confirmed with patient that she plans to return to ALF at discharge & CSW confirmed with Candace at ALF that they would be able to accept patient back at discharge.   Employment status:  Retired Health and safety inspector:  Medicare PT Recommendations:  Home with Home Health Information / Referral to community resources:     Patient/Family's Response to care:  Patient informed CSW that she has been living at [facility] since 2007 and has really enjoyed living there - she likes to keep busy by talking with other residents and doing puzzles to keep her mind sharp.   Patient/Family's Understanding of and Emotional Response to Diagnosis, Current Treatment, and Prognosis:  Patient informed CSW that she was admitted to the hospital due to a fall "blacked out" which she states was the first time that has happened, she has been dizzy before  but never "blacked out".   Emotional Assessment Appearance:  Appears younger than stated age Attitude/Demeanor/Rapport:    Affect (typically observed):  Pleasant, Happy Orientation:  Oriented to Self, Oriented to Place, Oriented to  Time, Oriented to Situation Alcohol / Substance use:    Psych involvement (Current and /or in the community):  No (Comment)  Discharge Needs  Concerns to be addressed:    Readmission within the last 30 days:    Current discharge risk:    Barriers to Discharge:      Arlyss Repress, LCSW 07/18/2014, 3:40 PM

## 2014-07-18 NOTE — Progress Notes (Signed)
Report called to candance at [facility]. Answered all questions.  Earnest Conroy. Clelia Croft, RN

## 2014-07-18 NOTE — Progress Notes (Signed)
Patient is set to discharge back to [facility] today. Patient & sister, Elizabeth aware. Discharge packet given to RN, Brooke. PTAR called for transport.     Chevi Lim, LCSW [facility] Clinical Social Worker cell #: [phone]   

## 2014-07-18 NOTE — Discharge Planning (Deleted)
Physician Discharge Summary  Ashlee Cobb FBX:[id number] DOB: [birth year] DOA: 07/17/2014  PCP: Gweneth Dimitri, MD  Admit date: 07/17/2014 Discharge date: 07/18/2014  Time spent: 50 minutes  Recommendations for Outpatient Follow-up:  1. HHPT  Discharge Condition: stable Diet recommendation: heart healthy, low sodium  Discharge Diagnoses:  Active Problems:   Syncope HTN Spinal stenosis- asymptomatic   History of present illness:  Per H and P:81 yo female with htn, hyperlipidemia, apparently was apparently just finishing brushing her teeth and had syncope. No presyncopal symptoms. Out 2-3 minutes. Pt had history of black out, and used to take medication for it but last was in 2006. Pt denies cp, palp, sob, n/v, focal neurological symptoms, seizure. Pt was seen in ED and to be admitted for syncope.   Hospital Course:  Syncope - orthostatic vitals negative-CT head, 2 D ECHO, EGK and carotid dopplers unrevealing-telemetry monitoring reviewed- no abnormalities-Troponin negative - she is asymptomatic and stable when ambulating - will d/c back to Assisted living with HHPT - TSH normal  HTN  Cont Lisiopril  Spinal stenosis  -noted on CT neck reveals stenosis of C 5-6 and C 6-7- patient has no symptoms of numbness, tingling or weakness of her arms  Procedures: Carotid Duplex Bilateral: 1-39% ICA stenosis right greater than left. Vertebral artery flow is retrograde on the right and antegrade on the left. Bilateral ECA stenosis left greater than right.    2D ECHO Left ventricle: The cavity size was normal. There was mild focal basal hypertrophy of the septum. Systolic function was normal. The estimated ejection fraction was in the range of 55% to 60%. Wall motion was normal; there were no regional wall motion abnormalities. There was an increased relative contribution of atrial contraction to ventricular filling. Doppler parameters are consistent with abnormal left  ventricular relaxation (grade 1 diastolic dysfunction). - Aortic valve: Mildly calcified annulus. Trileaflet; mildly thickened, mildly calcified leaflets. - Mitral valve: There was mild regurgitation.  Discharge Exam: Filed Weights   07/17/14 2003 07/18/14 0540  Weight: 66.679 kg (147 lb) 66.5 kg (146 lb 9.7 oz)   Filed Vitals:   07/18/14 1436  BP: 146/40  Pulse: 63  Temp: 98.5 F (36.9 C)  Resp: 16    General: AAO x 3, no distress Cardiovascular: RRR, no murmurs  Respiratory: clear to auscultation bilaterally GI: soft, non-tender, non-distended, bowel sound positive  Discharge Instructions You were cared for by a hospitalist during your hospital stay. If you have any questions about your discharge medications or the care you received while you were in the hospital after you are discharged, you can call the unit and asked to speak with the hospitalist on call if the hospitalist that took care of you is not available. Once you are discharged, your primary care physician will handle any further medical issues. Please note that NO REFILLS for any discharge medications will be authorized once you are discharged, as it is imperative that you return to your primary care physician (or establish a relationship with a primary care physician if you do not have one) for your aftercare needs so that they can reassess your need for medications and monitor your lab values.  Discharge Instructions    Diet - low sodium heart healthy    Complete by:  As directed      Increase activity slowly    Complete by:  As directed             Medication List    TAKE these medications  cholecalciferol 1000 UNITS tablet  Commonly known as:  VITAMIN D  Take 1,000 Units by mouth daily.     fexofenadine 180 MG tablet  Commonly known as:  ALLEGRA  Take 180 mg by mouth daily as needed for allergies or rhinitis.     lisinopril 10 MG tablet  Commonly known as:  PRINIVIL,ZESTRIL  Take 10 mg by  mouth daily.     Magnesium 250 MG Tabs  Take 1 tablet by mouth daily at 8 pm. At 5 pm to help with night time cramps     mirtazapine 15 MG disintegrating tablet  Commonly known as:  REMERON SOL-TAB  DISSOLVE 1 TAB BY MOUTH DAILY AT 8PM     multivitamins with iron Tabs tablet  Take 1 tablet by mouth daily.     simvastatin 20 MG tablet  Commonly known as:  ZOCOR  Take 20 mg by mouth daily.       No Known Allergies    The results of significant diagnostics from this hospitalization (including imaging, microbiology, ancillary and laboratory) are listed below for reference.    Significant Diagnostic Studies: Ct Head Wo Contrast  07/17/2014   ADDENDUM REPORT: 07/17/2014 18:46  ADDENDUM: Left parietal scalp soft tissue injury.   Electronically Signed   By: Sebastian Ache   On: 07/17/2014 18:46   07/17/2014   CLINICAL DATA:  Fall today with abrasion on the back of the head. No loss of consciousness. Initial encounter.  EXAM: CT HEAD WITHOUT CONTRAST  CT CERVICAL SPINE WITHOUT CONTRAST  TECHNIQUE: Multidetector CT imaging of the head and cervical spine was performed following the standard protocol without intravenous contrast. Multiplanar CT image reconstructions of the cervical spine were also generated.  COMPARISON:  None.  FINDINGS: CT HEAD FINDINGS  Mild generalized cerebral atrophy is within normal limits for age. Periventricular white-matter hypodensities are nonspecific but compatible with mild chronic small vessel ischemic disease. There is no evidence of acute cortical infarct, intracranial hemorrhage, mass, midline shift, or extra-axial fluid collection.  Prior bilateral cataract extraction is noted. The there is focal scalp soft tissue swelling with a small locular gas noted in the left parietal scalp. The mastoid air cells and paranasal sinuses are clear. No skull fracture is identified.  CT CERVICAL SPINE FINDINGS  There is slight anterolisthesis of C4 on C5, likely facet mediated. Mild,  focal deformity of the anterior superior C5 vertebral body does not appear acute, possibly reflecting remote injury. No acute cervical spine fracture is identified.  Moderate disc space narrowing and degenerative endplate spurring are present at C5-6 and C6-7. Partially calcified disc protrusions at C5-6 and C6-7 result in likely moderate spinal stenosis at both levels. Asymmetric, advanced facet arthrosis is present on the right at C3-4 and C4-5 resulting in moderate right-sided neural foraminal stenosis. Uncovertebral spurring results in moderate to severe neural foraminal stenosis bilaterally at C5-6 and C6-7. Mild scarring is noted in the lung apices. Advanced atherosclerotic vascular calcification is noted.  IMPRESSION: 1. No evidence of acute intracranial abnormality. 2. No acute osseous abnormality identified in the cervical spine. 3. Cervical disc degeneration, greatest at C5-6 and C6-7 where there is moderate spinal stenosis and bilateral neural foraminal stenosis.  Electronically Signed: By: Sebastian Ache On: 07/17/2014 18:38   Ct Cervical Spine Wo Contrast  07/17/2014   ADDENDUM REPORT: 07/17/2014 18:46  ADDENDUM: Left parietal scalp soft tissue injury.   Electronically Signed   By: Sebastian Ache   On: 07/17/2014 18:46   07/17/2014  CLINICAL DATA:  Fall today with abrasion on the back of the head. No loss of consciousness. Initial encounter.  EXAM: CT HEAD WITHOUT CONTRAST  CT CERVICAL SPINE WITHOUT CONTRAST  TECHNIQUE: Multidetector CT imaging of the head and cervical spine was performed following the standard protocol without intravenous contrast. Multiplanar CT image reconstructions of the cervical spine were also generated.  COMPARISON:  None.  FINDINGS: CT HEAD FINDINGS  Mild generalized cerebral atrophy is within normal limits for age. Periventricular white-matter hypodensities are nonspecific but compatible with mild chronic small vessel ischemic disease. There is no evidence of acute cortical  infarct, intracranial hemorrhage, mass, midline shift, or extra-axial fluid collection.  Prior bilateral cataract extraction is noted. The there is focal scalp soft tissue swelling with a small locular gas noted in the left parietal scalp. The mastoid air cells and paranasal sinuses are clear. No skull fracture is identified.  CT CERVICAL SPINE FINDINGS  There is slight anterolisthesis of C4 on C5, likely facet mediated. Mild, focal deformity of the anterior superior C5 vertebral body does not appear acute, possibly reflecting remote injury. No acute cervical spine fracture is identified.  Moderate disc space narrowing and degenerative endplate spurring are present at C5-6 and C6-7. Partially calcified disc protrusions at C5-6 and C6-7 result in likely moderate spinal stenosis at both levels. Asymmetric, advanced facet arthrosis is present on the right at C3-4 and C4-5 resulting in moderate right-sided neural foraminal stenosis. Uncovertebral spurring results in moderate to severe neural foraminal stenosis bilaterally at C5-6 and C6-7. Mild scarring is noted in the lung apices. Advanced atherosclerotic vascular calcification is noted.  IMPRESSION: 1. No evidence of acute intracranial abnormality. 2. No acute osseous abnormality identified in the cervical spine. 3. Cervical disc degeneration, greatest at C5-6 and C6-7 where there is moderate spinal stenosis and bilateral neural foraminal stenosis.  Electronically Signed: By: Sebastian Ache On: 07/17/2014 18:38    Microbiology: Recent Results (from the past 240 hour(s))  MRSA PCR Screening     Status: None   Collection Time: 07/17/14 10:30 PM  Result Value Ref Range Status   MRSA by PCR NEGATIVE NEGATIVE Final    Comment:        The GeneXpert MRSA Assay (FDA approved for NASAL specimens only), is one component of a comprehensive MRSA colonization surveillance program. It is not intended to diagnose MRSA infection nor to guide or monitor treatment  for MRSA infections.      Labs: Basic Metabolic Panel:  Recent Labs Lab 07/17/14 1758 07/18/14 0338  NA 137 140  K 4.0 4.0  CL 101 108  CO2  --  25  GLUCOSE 117* 105*  BUN 28* 18  CREATININE 1.00 0.82  CALCIUM  --  8.7*   Liver Function Tests:  Recent Labs Lab 07/18/14 0338  AST 23  ALT 14  ALKPHOS 52  BILITOT 0.4  PROT 6.3*  ALBUMIN 3.4*   No results for input(s): LIPASE, AMYLASE in the last 168 hours. No results for input(s): AMMONIA in the last 168 hours. CBC:  Recent Labs Lab 07/17/14 1752 07/17/14 1758 07/18/14 0338  WBC 7.2  --  6.8  HGB 12.0 13.3 11.1*  HCT 37.7 39.0 34.1*  MCV 92.6  --  92.4  PLT 288  --  261   Cardiac Enzymes:  Recent Labs Lab 07/17/14 2141 07/18/14 0338 07/18/14 0847  TROPONINI <0.03 <0.03 <0.03   BNP: BNP (last 3 results) No results for input(s): BNP in the last 8760 hours.  ProBNP (last 3 results) No results for input(s): PROBNP in the last 8760 hours.  CBG: No results for input(s): GLUCAP in the last 168 hours.     SignedCalvert Cantor, MD [facility] 07/18/2014, 4:10 PM

## 2014-07-18 NOTE — Care Management Note (Signed)
Case Management Note  Patient Details  Name: Ashlee Cobb MRN: [id number] Date of Birth: [birth year]  Subjective/Objective:                    Action/Plan:   Expected Discharge Date:                  Expected Discharge Plan:  Assisted Living / Rest Home  In-House Referral:  Clinical Social Work  Discharge planning Services  CM Consult  Post Acute Care Choice:    Choice offered to:     DME Arranged:    DME Agency:     HH Arranged:  PT HH Agency:     Status of Service:     Medicare Important Message Given:    Date Medicare IM Given:    Medicare IM give by:    Date Additional Medicare IM Given:    Additional Medicare Important Message give by:     If discussed at Long Length of Stay Meetings, dates discussed:    Additional Comments:Spoke with pt, PT will be arrived at ALF.   Geni Bers, RN 07/18/2014, 5:37 PM

## 2014-07-18 NOTE — Progress Notes (Signed)
Patient is set to discharge back to [facility] today. Patient & sister, Lanora Manis aware. Discharge packet given to RN, Nehemiah Settle. PTAR called for transport.     Lincoln Maxin, LCSW [facility] Clinical Social Worker cell #: [phone]

## 2014-07-18 NOTE — Progress Notes (Signed)
BP 85/46. Patient asymptomatic. NP on call notified. New order placed. Will continue to monitor closely.

## 2014-07-19 DIAGNOSIS — R55 Syncope and collapse: Secondary | ICD-10-CM | POA: Diagnosis not present

## 2014-07-19 DIAGNOSIS — I10 Essential (primary) hypertension: Secondary | ICD-10-CM

## 2014-07-19 LAB — HEMOGLOBIN A1C
Hgb A1c MFr Bld: 6.1 % — ABNORMAL HIGH (ref 4.8–5.6)
Mean Plasma Glucose: 128 mg/dL

## 2014-07-19 NOTE — Discharge Summary (Signed)
Physician Discharge Summary  Ashlee Cobb EAV:[id number] DOB: [birth year] DOA: 07/17/2014  PCP: Gweneth Dimitri, MD  Admit date: 07/17/2014 Discharge date: 07/19/2014  Time spent: 50 minutes  Recommendations for Outpatient Follow-up:  1. HHPT  Discharge Condition: stable Diet recommendation: heart healthy, low sodium  Discharge Diagnoses:  Active Problems:   Syncope HTN Spinal stenosis- asymptomatic   History of present illness:  Per H and P:81 yo female with htn, hyperlipidemia, apparently was apparently just finishing brushing her teeth and had syncope. No presyncopal symptoms. Out 2-3 minutes. Pt had history of black out, and used to take medication for it but last was in 2006. Pt denies cp, palp, sob, n/v, focal neurological symptoms, seizure. Pt was seen in ED and to be admitted for syncope.   Hospital Course:  Syncope - orthostatic vitals negative-CT head, 2 D ECHO, EGK and carotid dopplers unrevealing-telemetry monitoring reviewed- no abnormalities-Troponin negative - she is asymptomatic and stable when ambulating - will d/c back to Assisted living with HHPT - TSH normal  HTN  Cont Lisiopril  Spinal stenosis  -noted on CT neck reveals stenosis of C 5-6 and C 6-7- patient has no symptoms of numbness, tingling or weakness of her arms  Procedures: Carotid Duplex Bilateral: 1-39% ICA stenosis right greater than left. Vertebral artery flow is retrograde on the right and antegrade on the left. Bilateral ECA stenosis left greater than right.    2D ECHO Left ventricle: The cavity size was normal. There was mild focal basal hypertrophy of the septum. Systolic function was normal. The estimated ejection fraction was in the range of 55% to 60%. Wall motion was normal; there were no regional wall motion abnormalities. There was an increased relative contribution of atrial contraction to ventricular filling. Doppler parameters are consistent with abnormal left  ventricular relaxation (grade 1 diastolic dysfunction). - Aortic valve: Mildly calcified annulus. Trileaflet; mildly thickened, mildly calcified leaflets. - Mitral valve: There was mild regurgitation.  Discharge Exam: Filed Weights   07/17/14 2003 07/18/14 0540  Weight: 66.679 kg (147 lb) 66.5 kg (146 lb 9.7 oz)   Filed Vitals:   07/18/14 1436  BP: 146/40  Pulse: 63  Temp: 98.5 F (36.9 C)  Resp: 16    General: AAO x 3, no distress Cardiovascular: RRR, no murmurs  Respiratory: clear to auscultation bilaterally GI: soft, non-tender, non-distended, bowel sound positive  Discharge Instructions You were cared for by a hospitalist during your hospital stay. If you have any questions about your discharge medications or the care you received while you were in the hospital after you are discharged, you can call the unit and asked to speak with the hospitalist on call if the hospitalist that took care of you is not available. Once you are discharged, your primary care physician will handle any further medical issues. Please note that NO REFILLS for any discharge medications will be authorized once you are discharged, as it is imperative that you return to your primary care physician (or establish a relationship with a primary care physician if you do not have one) for your aftercare needs so that they can reassess your need for medications and monitor your lab values.      Discharge Instructions    Diet - low sodium heart healthy    Complete by:  As directed      Increase activity slowly    Complete by:  As directed             Medication List  TAKE these medications        cholecalciferol 1000 UNITS tablet  Commonly known as:  VITAMIN D  Take 1,000 Units by mouth daily.     fexofenadine 180 MG tablet  Commonly known as:  ALLEGRA  Take 180 mg by mouth daily as needed for allergies or rhinitis.     lisinopril 10 MG tablet  Commonly known as:  PRINIVIL,ZESTRIL  Take 10  mg by mouth daily.     Magnesium 250 MG Tabs  Take 1 tablet by mouth daily at 8 pm. At 5 pm to help with night time cramps     mirtazapine 15 MG disintegrating tablet  Commonly known as:  REMERON SOL-TAB  DISSOLVE 1 TAB BY MOUTH DAILY AT 8PM     multivitamins with iron Tabs tablet  Take 1 tablet by mouth daily.     simvastatin 20 MG tablet  Commonly known as:  ZOCOR  Take 20 mg by mouth daily.       No Known Allergies    The results of significant diagnostics from this hospitalization (including imaging, microbiology, ancillary and laboratory) are listed below for reference.    Significant Diagnostic Studies: Ct Head Wo Contrast  07/17/2014   ADDENDUM REPORT: 07/17/2014 18:46  ADDENDUM: Left parietal scalp soft tissue injury.   Electronically Signed   By: Sebastian Ache   On: 07/17/2014 18:46   07/17/2014   CLINICAL DATA:  Fall today with abrasion on the back of the head. No loss of consciousness. Initial encounter.  EXAM: CT HEAD WITHOUT CONTRAST  CT CERVICAL SPINE WITHOUT CONTRAST  TECHNIQUE: Multidetector CT imaging of the head and cervical spine was performed following the standard protocol without intravenous contrast. Multiplanar CT image reconstructions of the cervical spine were also generated.  COMPARISON:  None.  FINDINGS: CT HEAD FINDINGS  Mild generalized cerebral atrophy is within normal limits for age. Periventricular white-matter hypodensities are nonspecific but compatible with mild chronic small vessel ischemic disease. There is no evidence of acute cortical infarct, intracranial hemorrhage, mass, midline shift, or extra-axial fluid collection.  Prior bilateral cataract extraction is noted. The there is focal scalp soft tissue swelling with a small locular gas noted in the left parietal scalp. The mastoid air cells and paranasal sinuses are clear. No skull fracture is identified.  CT CERVICAL SPINE FINDINGS  There is slight anterolisthesis of C4 on C5, likely facet mediated.  Mild, focal deformity of the anterior superior C5 vertebral body does not appear acute, possibly reflecting remote injury. No acute cervical spine fracture is identified.  Moderate disc space narrowing and degenerative endplate spurring are present at C5-6 and C6-7. Partially calcified disc protrusions at C5-6 and C6-7 result in likely moderate spinal stenosis at both levels. Asymmetric, advanced facet arthrosis is present on the right at C3-4 and C4-5 resulting in moderate right-sided neural foraminal stenosis. Uncovertebral spurring results in moderate to severe neural foraminal stenosis bilaterally at C5-6 and C6-7. Mild scarring is noted in the lung apices. Advanced atherosclerotic vascular calcification is noted.  IMPRESSION: 1. No evidence of acute intracranial abnormality. 2. No acute osseous abnormality identified in the cervical spine. 3. Cervical disc degeneration, greatest at C5-6 and C6-7 where there is moderate spinal stenosis and bilateral neural foraminal stenosis.  Electronically Signed: By: Sebastian Ache On: 07/17/2014 18:38   Ct Cervical Spine Wo Contrast  07/17/2014   ADDENDUM REPORT: 07/17/2014 18:46  ADDENDUM: Left parietal scalp soft tissue injury.   Electronically Signed   By: Freida Busman  Mosetta Putt   On: 07/17/2014 18:46   07/17/2014   CLINICAL DATA:  Fall today with abrasion on the back of the head. No loss of consciousness. Initial encounter.  EXAM: CT HEAD WITHOUT CONTRAST  CT CERVICAL SPINE WITHOUT CONTRAST  TECHNIQUE: Multidetector CT imaging of the head and cervical spine was performed following the standard protocol without intravenous contrast. Multiplanar CT image reconstructions of the cervical spine were also generated.  COMPARISON:  None.  FINDINGS: CT HEAD FINDINGS  Mild generalized cerebral atrophy is within normal limits for age. Periventricular white-matter hypodensities are nonspecific but compatible with mild chronic small vessel ischemic disease. There is no evidence of acute  cortical infarct, intracranial hemorrhage, mass, midline shift, or extra-axial fluid collection.  Prior bilateral cataract extraction is noted. The there is focal scalp soft tissue swelling with a small locular gas noted in the left parietal scalp. The mastoid air cells and paranasal sinuses are clear. No skull fracture is identified.  CT CERVICAL SPINE FINDINGS  There is slight anterolisthesis of C4 on C5, likely facet mediated. Mild, focal deformity of the anterior superior C5 vertebral body does not appear acute, possibly reflecting remote injury. No acute cervical spine fracture is identified.  Moderate disc space narrowing and degenerative endplate spurring are present at C5-6 and C6-7. Partially calcified disc protrusions at C5-6 and C6-7 result in likely moderate spinal stenosis at both levels. Asymmetric, advanced facet arthrosis is present on the right at C3-4 and C4-5 resulting in moderate right-sided neural foraminal stenosis. Uncovertebral spurring results in moderate to severe neural foraminal stenosis bilaterally at C5-6 and C6-7. Mild scarring is noted in the lung apices. Advanced atherosclerotic vascular calcification is noted.  IMPRESSION: 1. No evidence of acute intracranial abnormality. 2. No acute osseous abnormality identified in the cervical spine. 3. Cervical disc degeneration, greatest at C5-6 and C6-7 where there is moderate spinal stenosis and bilateral neural foraminal stenosis.  Electronically Signed: By: Sebastian Ache On: 07/17/2014 18:38    Microbiology: Recent Results (from the past 240 hour(s))  MRSA PCR Screening     Status: None   Collection Time: 07/17/14 10:30 PM  Result Value Ref Range Status   MRSA by PCR NEGATIVE NEGATIVE Final    Comment:        The GeneXpert MRSA Assay (FDA approved for NASAL specimens only), is one component of a comprehensive MRSA colonization surveillance program. It is not intended to diagnose MRSA infection nor to guide or monitor  treatment for MRSA infections.      Labs: Basic Metabolic Panel:  Recent Labs Lab 07/17/14 1758 07/18/14 0338  NA 137 140  K 4.0 4.0  CL 101 108  CO2  --  25  GLUCOSE 117* 105*  BUN 28* 18  CREATININE 1.00 0.82  CALCIUM  --  8.7*   Liver Function Tests:  Recent Labs Lab 07/18/14 0338  AST 23  ALT 14  ALKPHOS 52  BILITOT 0.4  PROT 6.3*  ALBUMIN 3.4*   No results for input(s): LIPASE, AMYLASE in the last 168 hours. No results for input(s): AMMONIA in the last 168 hours. CBC:  Recent Labs Lab 07/17/14 1752 07/17/14 1758 07/18/14 0338  WBC 7.2  --  6.8  HGB 12.0 13.3 11.1*  HCT 37.7 39.0 34.1*  MCV 92.6  --  92.4  PLT 288  --  261   Cardiac Enzymes:  Recent Labs Lab 07/17/14 2141 07/18/14 0338 07/18/14 0847  TROPONINI <0.03 <0.03 <0.03   BNP: BNP (last 3  results) No results for input(s): BNP in the last 8760 hours.  ProBNP (last 3 results) No results for input(s): PROBNP in the last 8760 hours.  CBG: No results for input(s): GLUCAP in the last 168 hours.     SignedCalvert Cantor, MD [facility] 07/19/2014, 6:30 PM

## 2014-07-22 DIAGNOSIS — R262 Difficulty in walking, not elsewhere classified: Secondary | ICD-10-CM | POA: Diagnosis not present

## 2014-07-22 DIAGNOSIS — R739 Hyperglycemia, unspecified: Secondary | ICD-10-CM | POA: Diagnosis not present

## 2014-07-22 DIAGNOSIS — S0101XD Laceration without foreign body of scalp, subsequent encounter: Secondary | ICD-10-CM | POA: Diagnosis not present

## 2014-07-22 DIAGNOSIS — I10 Essential (primary) hypertension: Secondary | ICD-10-CM | POA: Diagnosis not present

## 2014-07-22 DIAGNOSIS — M4802 Spinal stenosis, cervical region: Secondary | ICD-10-CM | POA: Diagnosis not present

## 2014-07-22 DIAGNOSIS — E860 Dehydration: Secondary | ICD-10-CM | POA: Diagnosis not present

## 2014-07-22 DIAGNOSIS — Z8679 Personal history of other diseases of the circulatory system: Secondary | ICD-10-CM | POA: Diagnosis not present

## 2014-07-22 DIAGNOSIS — Z9181 History of falling: Secondary | ICD-10-CM | POA: Diagnosis not present

## 2014-07-22 DIAGNOSIS — R278 Other lack of coordination: Secondary | ICD-10-CM | POA: Diagnosis not present

## 2014-07-25 DIAGNOSIS — E860 Dehydration: Secondary | ICD-10-CM | POA: Diagnosis not present

## 2014-07-25 DIAGNOSIS — I10 Essential (primary) hypertension: Secondary | ICD-10-CM | POA: Diagnosis not present

## 2014-07-25 DIAGNOSIS — R55 Syncope and collapse: Secondary | ICD-10-CM | POA: Diagnosis not present

## 2014-07-25 DIAGNOSIS — Z9181 History of falling: Secondary | ICD-10-CM | POA: Diagnosis not present

## 2014-07-25 DIAGNOSIS — Z4802 Encounter for removal of sutures: Secondary | ICD-10-CM | POA: Diagnosis not present

## 2014-07-25 DIAGNOSIS — M4802 Spinal stenosis, cervical region: Secondary | ICD-10-CM | POA: Diagnosis not present

## 2014-07-25 DIAGNOSIS — R739 Hyperglycemia, unspecified: Secondary | ICD-10-CM | POA: Diagnosis not present

## 2014-07-25 DIAGNOSIS — S0101XD Laceration without foreign body of scalp, subsequent encounter: Secondary | ICD-10-CM | POA: Diagnosis not present

## 2014-07-26 DIAGNOSIS — R739 Hyperglycemia, unspecified: Secondary | ICD-10-CM | POA: Diagnosis not present

## 2014-07-26 DIAGNOSIS — S0101XD Laceration without foreign body of scalp, subsequent encounter: Secondary | ICD-10-CM | POA: Diagnosis not present

## 2014-07-26 DIAGNOSIS — M4802 Spinal stenosis, cervical region: Secondary | ICD-10-CM | POA: Diagnosis not present

## 2014-07-26 DIAGNOSIS — I10 Essential (primary) hypertension: Secondary | ICD-10-CM | POA: Diagnosis not present

## 2014-07-26 DIAGNOSIS — Z9181 History of falling: Secondary | ICD-10-CM | POA: Diagnosis not present

## 2014-07-26 DIAGNOSIS — E860 Dehydration: Secondary | ICD-10-CM | POA: Diagnosis not present

## 2014-07-28 DIAGNOSIS — Z9181 History of falling: Secondary | ICD-10-CM | POA: Diagnosis not present

## 2014-07-28 DIAGNOSIS — M4802 Spinal stenosis, cervical region: Secondary | ICD-10-CM | POA: Diagnosis not present

## 2014-07-28 DIAGNOSIS — S0101XD Laceration without foreign body of scalp, subsequent encounter: Secondary | ICD-10-CM | POA: Diagnosis not present

## 2014-07-28 DIAGNOSIS — I10 Essential (primary) hypertension: Secondary | ICD-10-CM | POA: Diagnosis not present

## 2014-07-28 DIAGNOSIS — E860 Dehydration: Secondary | ICD-10-CM | POA: Diagnosis not present

## 2014-07-28 DIAGNOSIS — R739 Hyperglycemia, unspecified: Secondary | ICD-10-CM | POA: Diagnosis not present

## 2014-08-01 DIAGNOSIS — I10 Essential (primary) hypertension: Secondary | ICD-10-CM | POA: Diagnosis not present

## 2014-08-01 DIAGNOSIS — R739 Hyperglycemia, unspecified: Secondary | ICD-10-CM | POA: Diagnosis not present

## 2014-08-01 DIAGNOSIS — M4802 Spinal stenosis, cervical region: Secondary | ICD-10-CM | POA: Diagnosis not present

## 2014-08-01 DIAGNOSIS — E860 Dehydration: Secondary | ICD-10-CM | POA: Diagnosis not present

## 2014-08-01 DIAGNOSIS — S0101XD Laceration without foreign body of scalp, subsequent encounter: Secondary | ICD-10-CM | POA: Diagnosis not present

## 2014-08-01 DIAGNOSIS — Z9181 History of falling: Secondary | ICD-10-CM | POA: Diagnosis not present

## 2014-08-02 DIAGNOSIS — S0101XD Laceration without foreign body of scalp, subsequent encounter: Secondary | ICD-10-CM | POA: Diagnosis not present

## 2014-08-02 DIAGNOSIS — Z9181 History of falling: Secondary | ICD-10-CM | POA: Diagnosis not present

## 2014-08-02 DIAGNOSIS — R739 Hyperglycemia, unspecified: Secondary | ICD-10-CM | POA: Diagnosis not present

## 2014-08-02 DIAGNOSIS — I10 Essential (primary) hypertension: Secondary | ICD-10-CM | POA: Diagnosis not present

## 2014-08-02 DIAGNOSIS — M4802 Spinal stenosis, cervical region: Secondary | ICD-10-CM | POA: Diagnosis not present

## 2014-08-02 DIAGNOSIS — E860 Dehydration: Secondary | ICD-10-CM | POA: Diagnosis not present

## 2014-08-03 DIAGNOSIS — Z9181 History of falling: Secondary | ICD-10-CM | POA: Diagnosis not present

## 2014-08-03 DIAGNOSIS — I10 Essential (primary) hypertension: Secondary | ICD-10-CM | POA: Diagnosis not present

## 2014-08-03 DIAGNOSIS — R739 Hyperglycemia, unspecified: Secondary | ICD-10-CM | POA: Diagnosis not present

## 2014-08-03 DIAGNOSIS — M4802 Spinal stenosis, cervical region: Secondary | ICD-10-CM | POA: Diagnosis not present

## 2014-08-03 DIAGNOSIS — S0101XD Laceration without foreign body of scalp, subsequent encounter: Secondary | ICD-10-CM | POA: Diagnosis not present

## 2014-08-03 DIAGNOSIS — E860 Dehydration: Secondary | ICD-10-CM | POA: Diagnosis not present

## 2014-08-04 DIAGNOSIS — I10 Essential (primary) hypertension: Secondary | ICD-10-CM | POA: Diagnosis not present

## 2014-08-04 DIAGNOSIS — Z9181 History of falling: Secondary | ICD-10-CM | POA: Diagnosis not present

## 2014-08-04 DIAGNOSIS — S0101XD Laceration without foreign body of scalp, subsequent encounter: Secondary | ICD-10-CM | POA: Diagnosis not present

## 2014-08-04 DIAGNOSIS — R739 Hyperglycemia, unspecified: Secondary | ICD-10-CM | POA: Diagnosis not present

## 2014-08-04 DIAGNOSIS — E860 Dehydration: Secondary | ICD-10-CM | POA: Diagnosis not present

## 2014-08-04 DIAGNOSIS — M4802 Spinal stenosis, cervical region: Secondary | ICD-10-CM | POA: Diagnosis not present

## 2014-08-09 DIAGNOSIS — I10 Essential (primary) hypertension: Secondary | ICD-10-CM | POA: Diagnosis not present

## 2014-08-09 DIAGNOSIS — Z9181 History of falling: Secondary | ICD-10-CM | POA: Diagnosis not present

## 2014-08-09 DIAGNOSIS — M4802 Spinal stenosis, cervical region: Secondary | ICD-10-CM | POA: Diagnosis not present

## 2014-08-09 DIAGNOSIS — E860 Dehydration: Secondary | ICD-10-CM | POA: Diagnosis not present

## 2014-08-09 DIAGNOSIS — R739 Hyperglycemia, unspecified: Secondary | ICD-10-CM | POA: Diagnosis not present

## 2014-08-09 DIAGNOSIS — S0101XD Laceration without foreign body of scalp, subsequent encounter: Secondary | ICD-10-CM | POA: Diagnosis not present

## 2014-08-11 DIAGNOSIS — E860 Dehydration: Secondary | ICD-10-CM | POA: Diagnosis not present

## 2014-08-11 DIAGNOSIS — R739 Hyperglycemia, unspecified: Secondary | ICD-10-CM | POA: Diagnosis not present

## 2014-08-11 DIAGNOSIS — M4802 Spinal stenosis, cervical region: Secondary | ICD-10-CM | POA: Diagnosis not present

## 2014-08-11 DIAGNOSIS — S0101XD Laceration without foreign body of scalp, subsequent encounter: Secondary | ICD-10-CM | POA: Diagnosis not present

## 2014-08-11 DIAGNOSIS — I10 Essential (primary) hypertension: Secondary | ICD-10-CM | POA: Diagnosis not present

## 2014-08-11 DIAGNOSIS — Z9181 History of falling: Secondary | ICD-10-CM | POA: Diagnosis not present

## 2014-08-12 DIAGNOSIS — M4802 Spinal stenosis, cervical region: Secondary | ICD-10-CM | POA: Diagnosis not present

## 2014-08-12 DIAGNOSIS — S0101XD Laceration without foreign body of scalp, subsequent encounter: Secondary | ICD-10-CM | POA: Diagnosis not present

## 2014-08-12 DIAGNOSIS — I10 Essential (primary) hypertension: Secondary | ICD-10-CM | POA: Diagnosis not present

## 2014-08-12 DIAGNOSIS — E860 Dehydration: Secondary | ICD-10-CM | POA: Diagnosis not present

## 2014-08-12 DIAGNOSIS — Z9181 History of falling: Secondary | ICD-10-CM | POA: Diagnosis not present

## 2014-08-12 DIAGNOSIS — R739 Hyperglycemia, unspecified: Secondary | ICD-10-CM | POA: Diagnosis not present

## 2014-08-16 DIAGNOSIS — S0101XD Laceration without foreign body of scalp, subsequent encounter: Secondary | ICD-10-CM | POA: Diagnosis not present

## 2014-08-16 DIAGNOSIS — Z9181 History of falling: Secondary | ICD-10-CM | POA: Diagnosis not present

## 2014-08-16 DIAGNOSIS — R739 Hyperglycemia, unspecified: Secondary | ICD-10-CM | POA: Diagnosis not present

## 2014-08-16 DIAGNOSIS — M4802 Spinal stenosis, cervical region: Secondary | ICD-10-CM | POA: Diagnosis not present

## 2014-08-16 DIAGNOSIS — E860 Dehydration: Secondary | ICD-10-CM | POA: Diagnosis not present

## 2014-08-16 DIAGNOSIS — I10 Essential (primary) hypertension: Secondary | ICD-10-CM | POA: Diagnosis not present

## 2014-08-19 DIAGNOSIS — S0101XD Laceration without foreign body of scalp, subsequent encounter: Secondary | ICD-10-CM | POA: Diagnosis not present

## 2014-08-19 DIAGNOSIS — M4802 Spinal stenosis, cervical region: Secondary | ICD-10-CM | POA: Diagnosis not present

## 2014-08-19 DIAGNOSIS — E860 Dehydration: Secondary | ICD-10-CM | POA: Diagnosis not present

## 2014-08-19 DIAGNOSIS — I10 Essential (primary) hypertension: Secondary | ICD-10-CM | POA: Diagnosis not present

## 2014-08-19 DIAGNOSIS — R739 Hyperglycemia, unspecified: Secondary | ICD-10-CM | POA: Diagnosis not present

## 2014-08-19 DIAGNOSIS — Z9181 History of falling: Secondary | ICD-10-CM | POA: Diagnosis not present

## 2014-08-22 DIAGNOSIS — R739 Hyperglycemia, unspecified: Secondary | ICD-10-CM | POA: Diagnosis not present

## 2014-08-22 DIAGNOSIS — S0101XD Laceration without foreign body of scalp, subsequent encounter: Secondary | ICD-10-CM | POA: Diagnosis not present

## 2014-08-22 DIAGNOSIS — E860 Dehydration: Secondary | ICD-10-CM | POA: Diagnosis not present

## 2014-08-22 DIAGNOSIS — I10 Essential (primary) hypertension: Secondary | ICD-10-CM | POA: Diagnosis not present

## 2014-08-22 DIAGNOSIS — M4802 Spinal stenosis, cervical region: Secondary | ICD-10-CM | POA: Diagnosis not present

## 2014-08-22 DIAGNOSIS — Z9181 History of falling: Secondary | ICD-10-CM | POA: Diagnosis not present

## 2014-08-25 DIAGNOSIS — E860 Dehydration: Secondary | ICD-10-CM | POA: Diagnosis not present

## 2014-08-25 DIAGNOSIS — Z9181 History of falling: Secondary | ICD-10-CM | POA: Diagnosis not present

## 2014-08-25 DIAGNOSIS — I10 Essential (primary) hypertension: Secondary | ICD-10-CM | POA: Diagnosis not present

## 2014-08-25 DIAGNOSIS — S0101XD Laceration without foreign body of scalp, subsequent encounter: Secondary | ICD-10-CM | POA: Diagnosis not present

## 2014-08-25 DIAGNOSIS — R739 Hyperglycemia, unspecified: Secondary | ICD-10-CM | POA: Diagnosis not present

## 2014-08-25 DIAGNOSIS — M4802 Spinal stenosis, cervical region: Secondary | ICD-10-CM | POA: Diagnosis not present

## 2014-08-30 DIAGNOSIS — E860 Dehydration: Secondary | ICD-10-CM | POA: Diagnosis not present

## 2014-08-30 DIAGNOSIS — R739 Hyperglycemia, unspecified: Secondary | ICD-10-CM | POA: Diagnosis not present

## 2014-08-30 DIAGNOSIS — Z9181 History of falling: Secondary | ICD-10-CM | POA: Diagnosis not present

## 2014-08-30 DIAGNOSIS — M4802 Spinal stenosis, cervical region: Secondary | ICD-10-CM | POA: Diagnosis not present

## 2014-08-30 DIAGNOSIS — I10 Essential (primary) hypertension: Secondary | ICD-10-CM | POA: Diagnosis not present

## 2014-08-30 DIAGNOSIS — S0101XD Laceration without foreign body of scalp, subsequent encounter: Secondary | ICD-10-CM | POA: Diagnosis not present

## 2014-08-31 DIAGNOSIS — R739 Hyperglycemia, unspecified: Secondary | ICD-10-CM | POA: Diagnosis not present

## 2014-08-31 DIAGNOSIS — I10 Essential (primary) hypertension: Secondary | ICD-10-CM | POA: Diagnosis not present

## 2014-08-31 DIAGNOSIS — M4802 Spinal stenosis, cervical region: Secondary | ICD-10-CM | POA: Diagnosis not present

## 2014-08-31 DIAGNOSIS — E860 Dehydration: Secondary | ICD-10-CM | POA: Diagnosis not present

## 2014-08-31 DIAGNOSIS — Z9181 History of falling: Secondary | ICD-10-CM | POA: Diagnosis not present

## 2014-08-31 DIAGNOSIS — S0101XD Laceration without foreign body of scalp, subsequent encounter: Secondary | ICD-10-CM | POA: Diagnosis not present

## 2014-11-01 DIAGNOSIS — J309 Allergic rhinitis, unspecified: Secondary | ICD-10-CM | POA: Diagnosis not present

## 2014-11-01 DIAGNOSIS — F3342 Major depressive disorder, recurrent, in full remission: Secondary | ICD-10-CM | POA: Diagnosis not present

## 2014-11-01 DIAGNOSIS — I10 Essential (primary) hypertension: Secondary | ICD-10-CM | POA: Diagnosis not present

## 2014-11-01 DIAGNOSIS — Z23 Encounter for immunization: Secondary | ICD-10-CM | POA: Diagnosis not present

## 2014-11-01 DIAGNOSIS — E782 Mixed hyperlipidemia: Secondary | ICD-10-CM | POA: Diagnosis not present

## 2014-11-01 DIAGNOSIS — Z1389 Encounter for screening for other disorder: Secondary | ICD-10-CM | POA: Diagnosis not present

## 2017-10-21 ENCOUNTER — Emergency Department (HOSPITAL_COMMUNITY)
Admission: EM | Admit: 2017-10-21 | Discharge: 2017-10-21 | Disposition: A | Discharge status: Home / Self Care | Payer: Medicare Other | Attending: Emergency Medicine | Admitting: Emergency Medicine

## 2017-10-21 ENCOUNTER — Other Ambulatory Visit: Payer: Self-pay

## 2017-10-21 ENCOUNTER — Encounter (HOSPITAL_COMMUNITY): Payer: Self-pay

## 2017-10-21 ENCOUNTER — Emergency Department (HOSPITAL_COMMUNITY): Payer: Medicare Other

## 2017-10-21 DIAGNOSIS — W19XXXA Unspecified fall, initial encounter: Secondary | ICD-10-CM

## 2017-10-21 DIAGNOSIS — Y92129 Unspecified place in nursing home as the place of occurrence of the external cause: Secondary | ICD-10-CM | POA: Diagnosis not present

## 2017-10-21 DIAGNOSIS — I10 Essential (primary) hypertension: Secondary | ICD-10-CM | POA: Diagnosis not present

## 2017-10-21 DIAGNOSIS — F17210 Nicotine dependence, cigarettes, uncomplicated: Secondary | ICD-10-CM | POA: Insufficient documentation

## 2017-10-21 DIAGNOSIS — S0990XA Unspecified injury of head, initial encounter: Secondary | ICD-10-CM | POA: Diagnosis present

## 2017-10-21 DIAGNOSIS — S0083XA Contusion of other part of head, initial encounter: Secondary | ICD-10-CM | POA: Diagnosis not present

## 2017-10-21 DIAGNOSIS — S61411A Laceration without foreign body of right hand, initial encounter: Secondary | ICD-10-CM

## 2017-10-21 DIAGNOSIS — Z79899 Other long term (current) drug therapy: Secondary | ICD-10-CM | POA: Insufficient documentation

## 2017-10-21 DIAGNOSIS — Y999 Unspecified external cause status: Secondary | ICD-10-CM | POA: Diagnosis not present

## 2017-10-21 DIAGNOSIS — Y9301 Activity, walking, marching and hiking: Secondary | ICD-10-CM | POA: Diagnosis not present

## 2017-10-21 DIAGNOSIS — S81011A Laceration without foreign body, right knee, initial encounter: Secondary | ICD-10-CM | POA: Diagnosis not present

## 2017-10-21 DIAGNOSIS — W0110XA Fall on same level from slipping, tripping and stumbling with subsequent striking against unspecified object, initial encounter: Secondary | ICD-10-CM | POA: Insufficient documentation

## 2017-10-21 LAB — BASIC METABOLIC PANEL
ANION GAP: 11 (ref 5–15)
BUN: 30 mg/dL — AB (ref 8–23)
CHLORIDE: 107 mmol/L (ref 98–111)
CO2: 27 mmol/L (ref 22–32)
Calcium: 10 mg/dL (ref 8.9–10.3)
Creatinine, Ser: 1.08 mg/dL — ABNORMAL HIGH (ref 0.44–1.00)
GFR, EST AFRICAN AMERICAN: 50 mL/min — AB (ref >60)
GFR, EST NON AFRICAN AMERICAN: 43 mL/min — AB (ref >60)
Glucose, Bld: 107 mg/dL — ABNORMAL HIGH (ref 70–99)
Potassium: 4.7 mmol/L (ref 3.5–5.1)
SODIUM: 145 mmol/L (ref 135–145)

## 2017-10-21 LAB — URINALYSIS, ROUTINE W REFLEX MICROSCOPIC
BACTERIA UA: NONE SEEN
Bilirubin Urine: NEGATIVE
Glucose, UA: NEGATIVE mg/dL
Hgb urine dipstick: NEGATIVE
Ketones, ur: 5 mg/dL — AB
Nitrite: NEGATIVE
PH: 7 (ref 5.0–8.0)
Protein, ur: NEGATIVE mg/dL
SPECIFIC GRAVITY, URINE: 1.005 (ref 1.005–1.030)

## 2017-10-21 LAB — CBC WITH DIFFERENTIAL/PLATELET
Basophils Absolute: 0 10*3/uL (ref 0.0–0.1)
Basophils Relative: 0 %
EOS PCT: 1 %
Eosinophils Absolute: 0 10*3/uL (ref 0.0–0.7)
HCT: 36.7 % (ref 36.0–46.0)
Hemoglobin: 11.9 g/dL — ABNORMAL LOW (ref 12.0–15.0)
Lymphocytes Relative: 31 %
Lymphs Abs: 2.5 10*3/uL (ref 0.7–4.0)
MCH: 30 pg (ref 26.0–34.0)
MCHC: 32.4 g/dL (ref 30.0–36.0)
MCV: 92.4 fL (ref 78.0–100.0)
Monocytes Absolute: 0.6 10*3/uL (ref 0.1–1.0)
Monocytes Relative: 7 %
Neutro Abs: 4.9 10*3/uL (ref 1.7–7.7)
Neutrophils Relative %: 61 %
PLATELETS: 307 10*3/uL (ref 150–400)
RBC: 3.97 MIL/uL (ref 3.87–5.11)
RDW: 14.4 % (ref 11.5–15.5)
WBC: 8 10*3/uL (ref 4.0–10.5)

## 2017-10-21 MED ORDER — LIDOCAINE-EPINEPHRINE (PF) 2 %-1:200000 IJ SOLN
20.0000 mL | Freq: Once | INTRAMUSCULAR | Status: AC
  Administered 2017-10-21: 20 mL via INTRADERMAL
  Filled 2017-10-21: qty 20

## 2017-10-21 NOTE — ED Notes (Signed)
Pt ambulated with assistance. Pt was unsteady walking, but was able to stand unassisted

## 2017-10-21 NOTE — ED Provider Notes (Signed)
[facility]-EMERGENCY DEPT Provider Note   CSN: [id number] Arrival date & time: 10/21/17  7846     History   Chief Complaint Chief Complaint  Patient presents with  . Fall    HPI Ashlee Cobb is a 84 y.o. female.  The history is provided by the patient and the EMS personnel. No language interpreter was used.  Fall    Ashlee Cobb is a 84 y.o. female who presents to the Emergency Department complaining of fall. She presents to the emergency department for evaluation of injuries following a mechanical fall. She resides at [facility] and was walking in for breakfast when she tripped and fell, striking her head. No loss of consciousness. She denies any recent illnesses. She reports pain to her right hand and right knee. Symptoms are moderate and constant nature. She reports that her immunizations are up to date. Past Medical History:  Diagnosis Date  . Hyperlipidemia   . Hypertension     Patient Active Problem List   Diagnosis Date Noted  . Syncope 07/17/2014    Past Surgical History:  Procedure Laterality Date  . ABDOMINAL HYSTERECTOMY       OB History   None      Home Medications    Prior to Admission medications   Medication Sig Start Date End Date Taking? Authorizing Provider  calcium carbonate (TUMS - DOSED IN MG ELEMENTAL CALCIUM) 500 MG chewable tablet Chew 1 tablet by mouth 2 (two) times daily.   Yes [provider]  cholecalciferol (VITAMIN D) 1000 UNITS tablet Take 2,000 Units by mouth daily.    Yes [provider]  fexofenadine (ALLEGRA) 180 MG tablet Take 180 mg by mouth daily as needed for allergies or rhinitis.   Yes [provider]  lisinopril (PRINIVIL,ZESTRIL) 10 MG tablet Take 10 mg by mouth daily.   Yes [provider]  Magnesium 250 MG TABS Take 1 tablet by mouth daily at 6 PM.    Yes [provider]  mirtazapine (REMERON SOL-TAB) 15 MG disintegrating tablet  DISSOLVE 1 TAB BY MOUTH DAILY AT 8PM 06/08/14  Yes [provider]  Multiple Vitamins-Iron (MULTIVITAMINS WITH IRON) TABS tablet Take 1 tablet by mouth daily.   Yes [provider]  simvastatin (ZOCOR) 20 MG tablet Take 20 mg by mouth at bedtime.    Yes [provider]    Family History Family History  Problem Relation Age of Onset  . Cirrhosis Father     Social History Social History   Tobacco Use  . Smoking status: Current Every Day Smoker    Packs/day: 1.00    Years: 60.00    Pack years: 60.00    Types: Cigarettes  . Smokeless tobacco: Never Used  Substance Use Topics  . Alcohol use: Not Currently    Alcohol/week: 1.0 standard drinks    Types: 1 Standard drinks or equivalent per week  . Drug use: Never     Allergies   Patient has no known allergies.   Review of Systems Review of Systems  All other systems reviewed and are negative.    Physical Exam Updated Vital Signs BP (!) 160/58 (BP Location: Left Arm)   Pulse 85   Temp 99 F (37.2 C) (Oral)   Resp 19   Ht 5\' 5"  (1.651 m)   Wt 65 kg   SpO2 100%   BMI 23.85 kg/m   Physical Exam  Constitutional: She is oriented to person, place, and  time. She appears well-developed and well-nourished.  HENT:  Head: Normocephalic.  Abrasion and ecchymosis to the central and right forehead  Cardiovascular: Normal rate and regular rhythm.  No murmur heard. Pulmonary/Chest: Effort normal and breath sounds normal. No respiratory distress.  Abdominal: Soft. There is no tenderness. There is no rebound and no guarding.  Musculoskeletal:  2+ radial and DP pulses bilaterally. There is a skin tear over the right elbow with range of motion intact, no local tenderness. There is a 2 cm laceration over the hypo thenar eminence with local tenderness to palpation. Flexion extension intact throughout all digits of the right hand. There is a large laceration over the right knee without local tenderness to  palpation. Flexion extension intact at the knee.  Neurological: She is alert and oriented to person, place, and time.  Skin: Skin is warm and dry.  Psychiatric: She has a normal mood and affect. Her behavior is normal.  Nursing note and vitals reviewed.    ED Treatments / Results  Labs (all labs ordered are listed, but only abnormal results are displayed) Labs Reviewed  URINALYSIS, ROUTINE W REFLEX MICROSCOPIC - Abnormal; Notable for the following components:      Result Value   Ketones, ur 5 (*)    Leukocytes, UA TRACE (*)    All other components within normal limits  BASIC METABOLIC PANEL - Abnormal; Notable for the following components:   Glucose, Bld 107 (*)    BUN 30 (*)    Creatinine, Ser 1.08 (*)    GFR calc non Af Amer 43 (*)    GFR calc Af Amer 50 (*)    All other components within normal limits  CBC WITH DIFFERENTIAL/PLATELET - Abnormal; Notable for the following components:   Hemoglobin 11.9 (*)    All other components within normal limits  URINE CULTURE    EKG None  Radiology Dg Elbow Complete Right  Result Date: 10/21/2017 CLINICAL DATA:  Larey Seat. EXAM: RIGHT ELBOW - COMPLETE 3+ VIEW COMPARISON:  None. FINDINGS: The joint spaces are maintained. No acute elbow fracture and no joint effusion. IMPRESSION: No acute bony findings. Electronically Signed   By: Rudie Meyer M.D.   On: 10/21/2017 10:04   Ct Head Wo Contrast  Result Date: 10/21/2017 CLINICAL DATA:  Unwitnessed fall EXAM: CT HEAD WITHOUT CONTRAST CT CERVICAL SPINE WITHOUT CONTRAST TECHNIQUE: Multidetector CT imaging of the head and cervical spine was performed following the standard protocol without intravenous contrast. Multiplanar CT image reconstructions of the cervical spine were also generated. COMPARISON:  07/17/2014 FINDINGS: CT HEAD FINDINGS Brain: Age related atrophy. No acute intracranial abnormality. Specifically, no hemorrhage, hydrocephalus, mass lesion, acute infarction, or significant  intracranial injury. Vascular: No hyperdense vessel or unexpected calcification. Skull: No acute calvarial abnormality. Sinuses/Orbits: Visualized paranasal sinuses and mastoids clear. Orbital soft tissues unremarkable. Other: Slight soft tissue swelling over the right forehead. CT CERVICAL SPINE FINDINGS Alignment: No subluxation Skull base and vertebrae: No acute fracture. No primary bone lesion or focal pathologic process. Soft tissues and spinal canal: No prevertebral fluid or swelling. No visible canal hematoma. Disc levels: Mild diffuse degenerative disc disease and facet disease bilaterally. Upper chest: No acute findings.  Biapical scarring. Other: Carotid artery calcifications.  No acute findings. IMPRESSION: No acute intracranial abnormality. No acute bony abnormality in the cervical spine.  Spondylosis. Electronically Signed   By: Charlett Nose M.D.   On: 10/21/2017 10:19   Ct Cervical Spine Wo Contrast  Result Date: 10/21/2017 CLINICAL DATA:  Unwitnessed fall EXAM: CT HEAD WITHOUT CONTRAST CT CERVICAL SPINE WITHOUT CONTRAST TECHNIQUE: Multidetector CT imaging of the head and cervical spine was performed following the standard protocol without intravenous contrast. Multiplanar CT image reconstructions of the cervical spine were also generated. COMPARISON:  07/17/2014 FINDINGS: CT HEAD FINDINGS Brain: Age related atrophy. No acute intracranial abnormality. Specifically, no hemorrhage, hydrocephalus, mass lesion, acute infarction, or significant intracranial injury. Vascular: No hyperdense vessel or unexpected calcification. Skull: No acute calvarial abnormality. Sinuses/Orbits: Visualized paranasal sinuses and mastoids clear. Orbital soft tissues unremarkable. Other: Slight soft tissue swelling over the right forehead. CT CERVICAL SPINE FINDINGS Alignment: No subluxation Skull base and vertebrae: No acute fracture. No primary bone lesion or focal pathologic process. Soft tissues and spinal canal: No  prevertebral fluid or swelling. No visible canal hematoma. Disc levels: Mild diffuse degenerative disc disease and facet disease bilaterally. Upper chest: No acute findings.  Biapical scarring. Other: Carotid artery calcifications.  No acute findings. IMPRESSION: No acute intracranial abnormality. No acute bony abnormality in the cervical spine.  Spondylosis. Electronically Signed   By: Charlett NoseKevin  Dover M.D.   On: 10/21/2017 10:19   Dg Knee Complete 4 Views Right  Result Date: 10/21/2017 CLINICAL DATA:  Larey SeatFell. EXAM: RIGHT KNEE - COMPLETE 4+ VIEW COMPARISON:  None. FINDINGS: The joint spaces are fairly well maintained for the patient's age. There is mild medial compartment degenerative changes. No acute fracture or osteochondral lesion. No joint effusion. The lateral film demonstrates soft tissue thickening anteriorly which is likely from a direct contusion/hematoma. Prominence of the inferior pole of the patella may be due to prior injury or congenital anomaly. IMPRESSION: 1. No acute fracture or joint effusion. 2. Mild degenerative changes for age. 3. Prepatellar hematoma. Electronically Signed   By: Rudie MeyerP.  Gallerani M.D.   On: 10/21/2017 10:07   Dg Hand Complete Right  Result Date: 10/21/2017 CLINICAL DATA:  Fall, hand pain. EXAM: RIGHT HAND - COMPLETE 3+ VIEW COMPARISON:  None. FINDINGS: Advanced degenerative changes throughout the IP joints. Mild joint space narrowing throughout the MCP joints and wrist joints. No acute bony abnormality. Specifically, no fracture, subluxation, or dislocation. IMPRESSION: Advanced arthritic changes throughout the right hand. No acute bony abnormality. Electronically Signed   By: Charlett NoseKevin  Dover M.D.   On: 10/21/2017 10:01    Procedures .Marland Kitchen.Laceration Repair Date/Time: 10/21/2017 11:25 AM Performed by: Tilden Fossaees, Lyrique Hakim, MD Authorized by: Tilden Fossaees, Damar Petit, MD   Consent:    Consent obtained:  Verbal   Consent given by:  Patient   Risks discussed:  Infection and pain Anesthesia  (see MAR for exact dosages):    Anesthesia method:  Local infiltration   Local anesthetic:  Lidocaine 2% WITH epi Laceration details:    Location:  Hand   Hand location:  R palm   Length (cm):  2 Repair type:    Repair type:  Simple Pre-procedure details:    Preparation:  Patient was prepped and draped in usual sterile fashion Exploration:    Wound exploration: wound explored through full range of motion     Contaminated: no   Treatment:    Area cleansed with:  Betadine   Amount of cleaning:  Standard   Irrigation solution:  Sterile saline Skin repair:    Repair method:  Sutures   Suture size:  4-0   Suture material:  Prolene   Suture technique:  Simple interrupted   Number of sutures:  3 Post-procedure details:    Dressing:  Antibiotic ointment   Patient tolerance of  procedure:  Tolerated well, no immediate complications .Marland KitchenLaceration Repair Date/Time: 10/21/2017 11:26 AM Performed by: Tilden Fossa, MD Authorized by: Tilden Fossa, MD   Consent:    Consent obtained:  Verbal   Consent given by:  Patient   Risks discussed:  Infection, pain and poor cosmetic result Anesthesia (see MAR for exact dosages):    Anesthesia method:  Local infiltration   Local anesthetic:  Lidocaine 2% WITH epi Laceration details:    Location:  Leg   Leg location:  R knee   Length (cm):  8 Repair type:    Repair type:  Complex Pre-procedure details:    Preparation:  Patient was prepped and draped in usual sterile fashion Exploration:    Wound exploration: wound explored through full range of motion     Wound extent: no tendon damage noted and no underlying fracture noted     Contaminated: no   Treatment:    Area cleansed with:  Betadine   Amount of cleaning:  Standard   Irrigation solution:  Sterile saline   Debridement:  Minimal   Undermining:  None Skin repair:    Repair method:  Sutures   Suture size:  3-0   Suture material:  Prolene   Suture technique:  Simple interrupted    Number of sutures:  7 Post-procedure details:    Dressing:  Antibiotic ointment and sterile dressing   Patient tolerance of procedure:  Tolerated well, no immediate complications   (including critical care time)  Medications Ordered in ED Medications  lidocaine-EPINEPHrine (XYLOCAINE W/EPI) 2 %-1:200000 (PF) injection 20 mL (20 mLs Intradermal Given by Other 10/21/17 1050)     Initial Impression / Assessment and Plan / ED Course  I have reviewed the triage vital signs and the nursing notes.  Pertinent labs & imaging results that were available during my care of the patient were reviewed by me and considered in my medical decision making (see chart for details).    Patient here for evaluation of injuries following a mechanical fall. She declines any pain medications in the emergency department. Wound repaired per procedure note. The knee laceration is deep but there is no evidence of tendon injury or open joint. Discussed with patient home care for lacerations as well as contusions following fall. Discussed outpatient follow-up and return precautions.   Final Clinical Impressions(s) / ED Diagnoses   Final diagnoses:  Fall, initial encounter  Contusion of face, initial encounter  Laceration of right hand without foreign body, initial encounter  Laceration of right knee, initial encounter    ED Discharge Orders    None       Tilden Fossa, MD 10/21/17 1533

## 2017-10-21 NOTE — ED Triage Notes (Signed)
[facility] [place] Facility called 911 due to patient having unwitnessed fall. Pt tripped and fell over own feet. Pt does not take blood thinner. Pt has several skin abrasions on right hand and elbow. Pt has skin tear on right side of head, Laceration on right knee. Pt does have dementia and is Alert to self and situation. Pt is at baseline.   Unaware if loss of consciousness happened.

## 2017-10-21 NOTE — ED Notes (Signed)
[facility] called  

## 2017-10-21 NOTE — ED Notes (Signed)
ED Provider at bedside. 

## 2017-10-21 NOTE — ED Notes (Signed)
Pt attempted to supply a urine culture but was unsuccessful. Will call out when she can attempt again

## 2017-10-21 NOTE — ED Notes (Signed)
Patient transported to CT 

## 2017-10-21 NOTE — ED Notes (Signed)
Bed: [id number] Expected date:  Expected time:  Means of arrival:  Comments: EMS 84yo fall

## 2017-10-21 NOTE — ED Notes (Signed)
Patient transported to X-ray 

## 2017-10-22 LAB — URINE CULTURE: CULTURE: NO GROWTH

## 2019-10-08 IMAGING — CT CT CERVICAL SPINE W/O CM
4 of 8 series · 10 of 33 positions shown, 11 images · non-contrast
Comparison: 07/17/2014

CLINICAL DATA: Unwitnessed fall

EXAM:
CT HEAD WITHOUT CONTRAST
CT CERVICAL SPINE WITHOUT CONTRAST
TECHNIQUE: Multidetector CT imaging of the head and cervical spine was
performed following the standard protocol without intravenous
contrast. Multiplanar CT image reconstructions of the cervical spine
were also generated.

[Series 9: c spine soft · axial · 0.27mm/px · z∈[-164,-114]mm · 2 of 76 slices shown]
[im 26/76  soft-tissue]
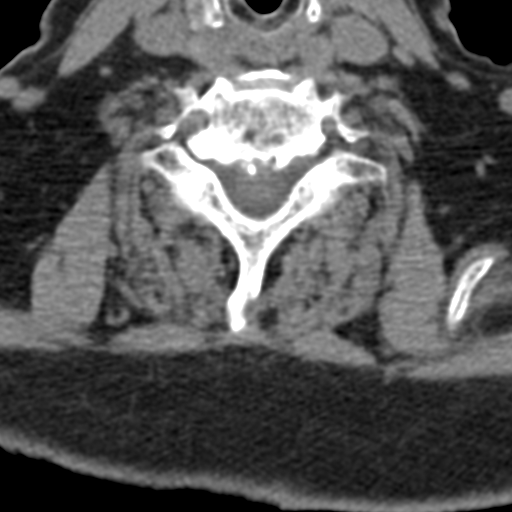
[im 51/76  soft-tissue]
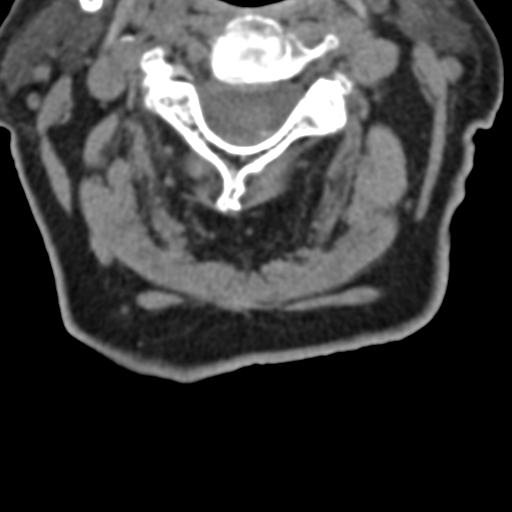

[Series 11: orthogonal bone · axial · 0.23mm/px · z∈[-190,-111]mm · 3 of 82 slices shown, 4 images]
[im 21/82  soft-tissue]
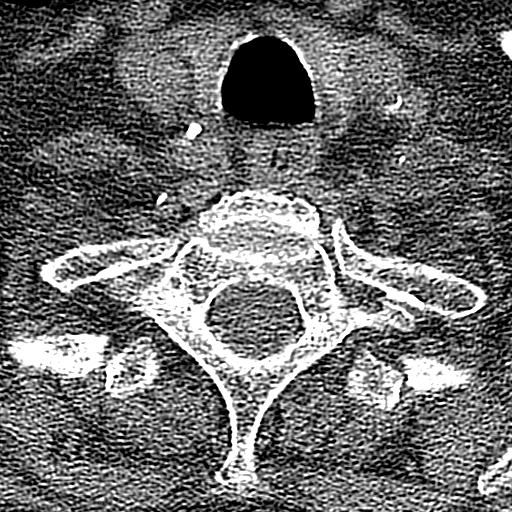
[im 21/82  bone]
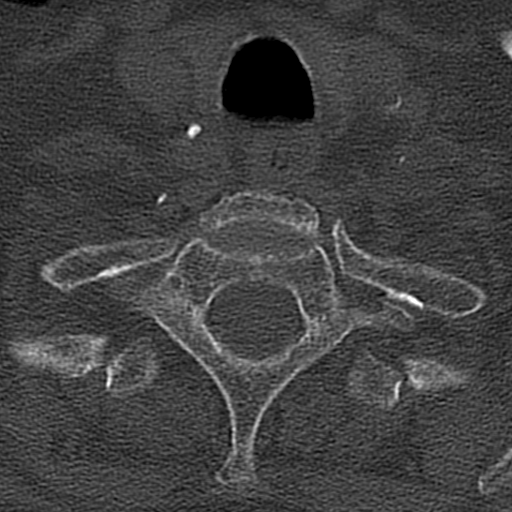
[im 41/82  bone]
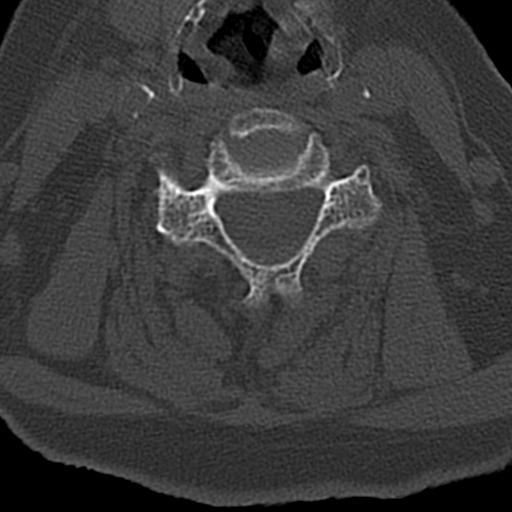
[im 61/82  bone]
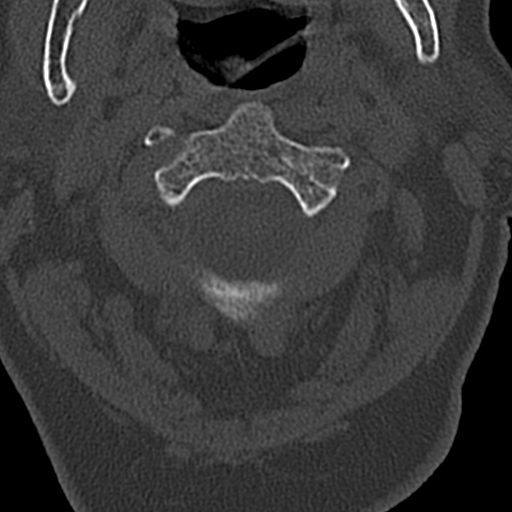

[Series 12: coronal bone · coronal · 0.23mm/px · 1 of 61 slices shown]
[im 31/61  bone]
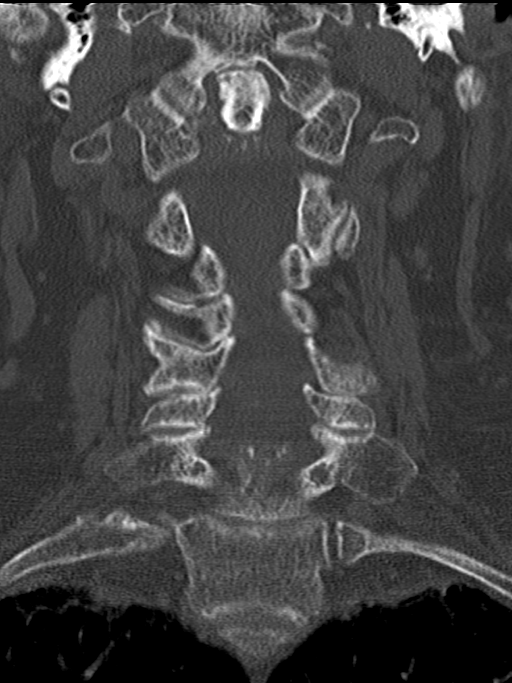

[Series 13: sagittal bone · sagittal · 0.23mm/px · 4 of 61 slices shown]
[im 13/61  bone]
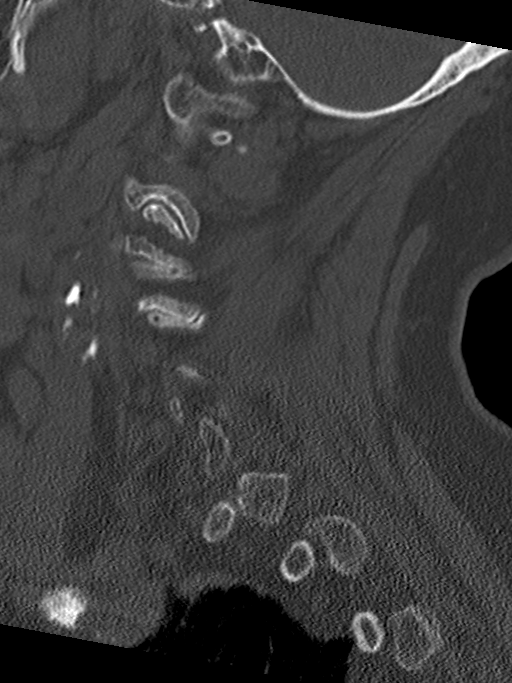
[im 25/61  bone]
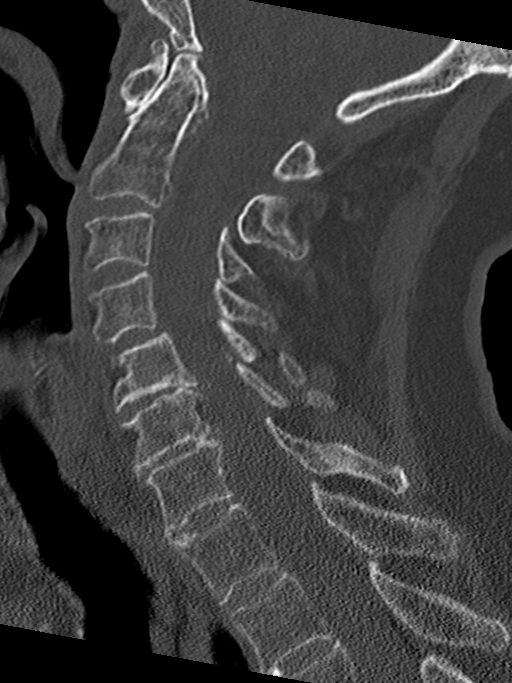
[im 37/61  bone]
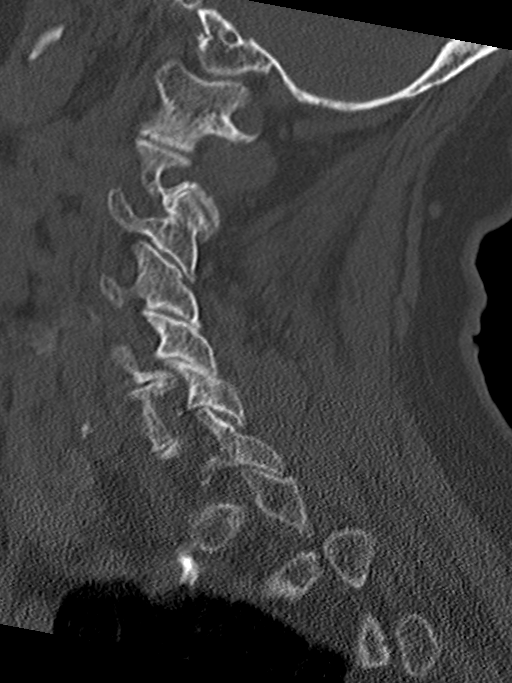
[im 49/61  bone]
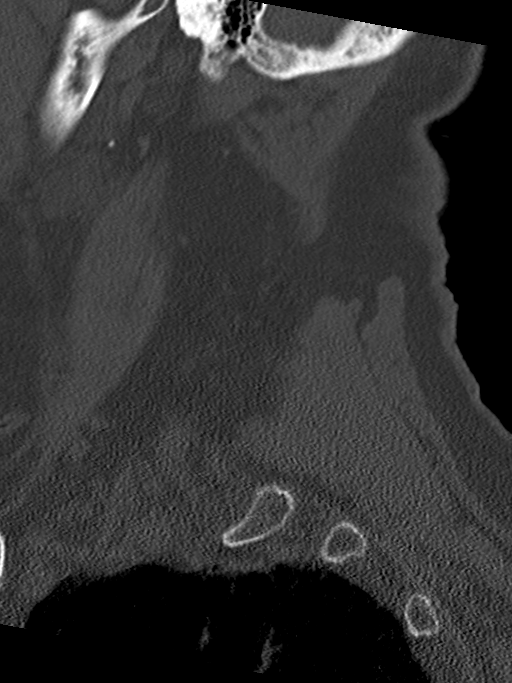

[10 of 33 positions shown; findings below may reference images not displayed]

FINDINGS: CT HEAD FINDINGS

Brain: Age related atrophy. No acute intracranial abnormality.
Specifically, no hemorrhage, hydrocephalus, mass lesion, acute
infarction, or significant intracranial injury.

Vascular: No hyperdense vessel or unexpected calcification.

Skull: No acute calvarial abnormality.

Sinuses/Orbits: Visualized paranasal sinuses and mastoids clear.
Orbital soft tissues unremarkable.

Other: Slight soft tissue swelling over the right forehead.

CT CERVICAL SPINE FINDINGS

Alignment: No subluxation

Skull base and vertebrae: No acute fracture. No primary bone lesion
or focal pathologic process.

Soft tissues and spinal canal: No prevertebral fluid or swelling. No
visible canal hematoma.

Disc levels: Mild diffuse degenerative disc disease and facet
disease bilaterally.

Upper chest: No acute findings.  Biapical scarring.

Other: Carotid artery calcifications.  No acute findings.
IMPRESSION: No acute intracranial abnormality.

No acute bony abnormality in the cervical spine.  Spondylosis.

## 2019-11-19 ENCOUNTER — Other Ambulatory Visit: Payer: Self-pay

## 2019-11-19 ENCOUNTER — Emergency Department (HOSPITAL_COMMUNITY): Payer: Medicare Other

## 2019-11-19 ENCOUNTER — Encounter (HOSPITAL_COMMUNITY): Payer: Self-pay

## 2019-11-19 ENCOUNTER — Inpatient Hospital Stay (HOSPITAL_COMMUNITY)
Admission: EM | Admit: 2019-11-19 | Discharge: 2019-12-06 | DRG: 388 | Disposition: E | Payer: Medicare Other | Attending: Internal Medicine | Admitting: Internal Medicine

## 2019-11-19 DIAGNOSIS — E785 Hyperlipidemia, unspecified: Secondary | ICD-10-CM | POA: Diagnosis present

## 2019-11-19 DIAGNOSIS — R6510 Systemic inflammatory response syndrome (SIRS) of non-infectious origin without acute organ dysfunction: Secondary | ICD-10-CM | POA: Diagnosis not present

## 2019-11-19 DIAGNOSIS — I340 Nonrheumatic mitral (valve) insufficiency: Secondary | ICD-10-CM | POA: Diagnosis not present

## 2019-11-19 DIAGNOSIS — F17210 Nicotine dependence, cigarettes, uncomplicated: Secondary | ICD-10-CM | POA: Diagnosis present

## 2019-11-19 DIAGNOSIS — I358 Other nonrheumatic aortic valve disorders: Secondary | ICD-10-CM | POA: Diagnosis present

## 2019-11-19 DIAGNOSIS — J90 Pleural effusion, not elsewhere classified: Secondary | ICD-10-CM | POA: Diagnosis present

## 2019-11-19 DIAGNOSIS — Z79899 Other long term (current) drug therapy: Secondary | ICD-10-CM

## 2019-11-19 DIAGNOSIS — I071 Rheumatic tricuspid insufficiency: Secondary | ICD-10-CM | POA: Diagnosis present

## 2019-11-19 DIAGNOSIS — Z20822 Contact with and (suspected) exposure to covid-19: Secondary | ICD-10-CM | POA: Diagnosis present

## 2019-11-19 DIAGNOSIS — K56609 Unspecified intestinal obstruction, unspecified as to partial versus complete obstruction: Secondary | ICD-10-CM

## 2019-11-19 DIAGNOSIS — R509 Fever, unspecified: Secondary | ICD-10-CM

## 2019-11-19 DIAGNOSIS — R7989 Other specified abnormal findings of blood chemistry: Secondary | ICD-10-CM | POA: Diagnosis not present

## 2019-11-19 DIAGNOSIS — Z0189 Encounter for other specified special examinations: Secondary | ICD-10-CM

## 2019-11-19 DIAGNOSIS — I10 Essential (primary) hypertension: Secondary | ICD-10-CM | POA: Diagnosis present

## 2019-11-19 DIAGNOSIS — K565 Intestinal adhesions [bands], unspecified as to partial versus complete obstruction: Secondary | ICD-10-CM

## 2019-11-19 DIAGNOSIS — K56600 Partial intestinal obstruction, unspecified as to cause: Secondary | ICD-10-CM | POA: Diagnosis present

## 2019-11-19 DIAGNOSIS — N179 Acute kidney failure, unspecified: Secondary | ICD-10-CM | POA: Diagnosis present

## 2019-11-19 DIAGNOSIS — G9341 Metabolic encephalopathy: Secondary | ICD-10-CM | POA: Diagnosis not present

## 2019-11-19 DIAGNOSIS — I493 Ventricular premature depolarization: Secondary | ICD-10-CM | POA: Diagnosis not present

## 2019-11-19 DIAGNOSIS — J449 Chronic obstructive pulmonary disease, unspecified: Secondary | ICD-10-CM | POA: Diagnosis present

## 2019-11-19 DIAGNOSIS — I129 Hypertensive chronic kidney disease with stage 1 through stage 4 chronic kidney disease, or unspecified chronic kidney disease: Secondary | ICD-10-CM | POA: Diagnosis present

## 2019-11-19 DIAGNOSIS — I214 Non-ST elevation (NSTEMI) myocardial infarction: Secondary | ICD-10-CM | POA: Diagnosis not present

## 2019-11-19 DIAGNOSIS — Z90710 Acquired absence of both cervix and uterus: Secondary | ICD-10-CM

## 2019-11-19 DIAGNOSIS — Z66 Do not resuscitate: Secondary | ICD-10-CM | POA: Diagnosis present

## 2019-11-19 DIAGNOSIS — E876 Hypokalemia: Secondary | ICD-10-CM | POA: Diagnosis not present

## 2019-11-19 DIAGNOSIS — I361 Nonrheumatic tricuspid (valve) insufficiency: Secondary | ICD-10-CM | POA: Diagnosis not present

## 2019-11-19 DIAGNOSIS — N1831 Chronic kidney disease, stage 3a: Secondary | ICD-10-CM | POA: Diagnosis present

## 2019-11-19 DIAGNOSIS — R0603 Acute respiratory distress: Secondary | ICD-10-CM

## 2019-11-19 DIAGNOSIS — R9431 Abnormal electrocardiogram [ECG] [EKG]: Secondary | ICD-10-CM | POA: Diagnosis not present

## 2019-11-19 DIAGNOSIS — R109 Unspecified abdominal pain: Secondary | ICD-10-CM | POA: Diagnosis present

## 2019-11-19 HISTORY — DX: Unspecified asthma, uncomplicated: J45.909

## 2019-11-19 HISTORY — DX: Chronic obstructive pulmonary disease, unspecified: J44.9

## 2019-11-19 LAB — URINALYSIS, ROUTINE W REFLEX MICROSCOPIC
Bacteria, UA: NONE SEEN
Bilirubin Urine: NEGATIVE
Glucose, UA: NEGATIVE mg/dL
Hgb urine dipstick: NEGATIVE
Ketones, ur: 5 mg/dL — AB
Nitrite: NEGATIVE
Protein, ur: NEGATIVE mg/dL
Specific Gravity, Urine: 1.02 (ref 1.005–1.030)
pH: 5 (ref 5.0–8.0)

## 2019-11-19 LAB — I-STAT CHEM 8, ED
BUN: 36 mg/dL — ABNORMAL HIGH (ref 8–23)
Calcium, Ion: 1.18 mmol/L (ref 1.15–1.40)
Chloride: 104 mmol/L (ref 98–111)
Creatinine, Ser: 1.1 mg/dL — ABNORMAL HIGH (ref 0.44–1.00)
Glucose, Bld: 119 mg/dL — ABNORMAL HIGH (ref 70–99)
HCT: 36 % (ref 36.0–46.0)
Hemoglobin: 12.2 g/dL (ref 12.0–15.0)
Potassium: 5.1 mmol/L (ref 3.5–5.1)
Sodium: 138 mmol/L (ref 135–145)
TCO2: 28 mmol/L (ref 22–32)

## 2019-11-19 LAB — COMPREHENSIVE METABOLIC PANEL
ALT: 14 U/L (ref 0–44)
AST: 26 U/L (ref 15–41)
Albumin: 4.2 g/dL (ref 3.5–5.0)
Alkaline Phosphatase: 63 U/L (ref 38–126)
Anion gap: 13 (ref 5–15)
BUN: 30 mg/dL — ABNORMAL HIGH (ref 8–23)
CO2: 24 mmol/L (ref 22–32)
Calcium: 9.4 mg/dL (ref 8.9–10.3)
Chloride: 103 mmol/L (ref 98–111)
Creatinine, Ser: 1.11 mg/dL — ABNORMAL HIGH (ref 0.44–1.00)
GFR, Estimated: 42 mL/min — ABNORMAL LOW (ref >60)
Glucose, Bld: 123 mg/dL — ABNORMAL HIGH (ref 70–99)
Potassium: 4.7 mmol/L (ref 3.5–5.1)
Sodium: 140 mmol/L (ref 135–145)
Total Bilirubin: 0.7 mg/dL (ref 0.3–1.2)
Total Protein: 7.4 g/dL (ref 6.5–8.1)

## 2019-11-19 LAB — RESPIRATORY PANEL BY RT PCR (FLU A&B, COVID)
Influenza A by PCR: NEGATIVE
Influenza B by PCR: NEGATIVE
SARS Coronavirus 2 by RT PCR: NEGATIVE

## 2019-11-19 LAB — TYPE AND SCREEN
ABO/RH(D): O POS
Antibody Screen: NEGATIVE

## 2019-11-19 LAB — CBC WITH DIFFERENTIAL/PLATELET
Abs Immature Granulocytes: 0.03 10*3/uL (ref 0.00–0.07)
Basophils Absolute: 0 10*3/uL (ref 0.0–0.1)
Basophils Relative: 0 %
Eosinophils Absolute: 0 10*3/uL (ref 0.0–0.5)
Eosinophils Relative: 0 %
HCT: 36.6 % (ref 36.0–46.0)
Hemoglobin: 11.4 g/dL — ABNORMAL LOW (ref 12.0–15.0)
Immature Granulocytes: 0 %
Lymphocytes Relative: 13 %
Lymphs Abs: 1.4 10*3/uL (ref 0.7–4.0)
MCH: 29.8 pg (ref 26.0–34.0)
MCHC: 31.1 g/dL (ref 30.0–36.0)
MCV: 95.8 fL (ref 80.0–100.0)
Monocytes Absolute: 0.5 10*3/uL (ref 0.1–1.0)
Monocytes Relative: 5 %
Neutro Abs: 9 10*3/uL — ABNORMAL HIGH (ref 1.7–7.7)
Neutrophils Relative %: 82 %
Platelets: 316 10*3/uL (ref 150–400)
RBC: 3.82 MIL/uL — ABNORMAL LOW (ref 3.87–5.11)
RDW: 14.9 % (ref 11.5–15.5)
WBC: 11 10*3/uL — ABNORMAL HIGH (ref 4.0–10.5)
nRBC: 0 % (ref 0.0–0.2)

## 2019-11-19 LAB — TROPONIN I (HIGH SENSITIVITY): Troponin I (High Sensitivity): 8 ng/L (ref <18)

## 2019-11-19 LAB — LIPASE, BLOOD: Lipase: 35 U/L (ref 11–51)

## 2019-11-19 LAB — LACTIC ACID, PLASMA: Lactic Acid, Venous: 1.5 mmol/L (ref 0.5–1.9)

## 2019-11-19 MED ORDER — LACTATED RINGERS IV BOLUS
1000.0000 mL | Freq: Once | INTRAVENOUS | Status: AC
  Administered 2019-11-19: 1000 mL via INTRAVENOUS

## 2019-11-19 MED ORDER — SODIUM CHLORIDE 0.9 % IV BOLUS
1000.0000 mL | Freq: Once | INTRAVENOUS | Status: AC
  Administered 2019-11-19: 1000 mL via INTRAVENOUS

## 2019-11-19 MED ORDER — IOHEXOL 300 MG/ML  SOLN
100.0000 mL | Freq: Once | INTRAMUSCULAR | Status: AC | PRN
  Administered 2019-11-19: 100 mL via INTRAVENOUS

## 2019-11-19 MED ORDER — MORPHINE SULFATE (PF) 4 MG/ML IV SOLN
4.0000 mg | Freq: Once | INTRAVENOUS | Status: AC
  Administered 2019-11-19: 4 mg via INTRAVENOUS
  Filled 2019-11-19: qty 1

## 2019-11-19 MED ORDER — FENTANYL CITRATE (PF) 100 MCG/2ML IJ SOLN: 50.0000 ug | Freq: Once | INTRAMUSCULAR | Status: DC

## 2019-11-19 MED ORDER — LIDOCAINE VISCOUS HCL 2 % MT SOLN
15.0000 mL | Freq: Once | OROMUCOSAL | Status: AC
  Administered 2019-11-19: 15 mL via OROMUCOSAL
  Filled 2019-11-19: qty 15

## 2019-11-19 MED ORDER — ONDANSETRON HCL 4 MG/2ML IJ SOLN: 4.0000 mg | Freq: Four times a day (QID) | INTRAMUSCULAR | Status: DC | PRN

## 2019-11-19 MED ORDER — ONDANSETRON HCL 4 MG/2ML IJ SOLN
4.0000 mg | Freq: Once | INTRAMUSCULAR | Status: AC
  Administered 2019-11-19: 4 mg via INTRAVENOUS
  Filled 2019-11-19: qty 2

## 2019-11-19 MED ORDER — LACTATED RINGERS IV SOLN: INTRAVENOUS | Status: AC

## 2019-11-19 MED ORDER — MORPHINE SULFATE (PF) 2 MG/ML IV SOLN
2.0000 mg | INTRAVENOUS | Status: DC | PRN
  Administered 2019-11-19: 22:00:00 2 mg via INTRAVENOUS
  Filled 2019-11-19: qty 1

## 2019-11-19 MED ORDER — ONDANSETRON HCL 4 MG PO TABS: 4.0000 mg | ORAL_TABLET | Freq: Four times a day (QID) | ORAL | Status: DC | PRN

## 2019-11-19 NOTE — ED Notes (Signed)
Report called to third floor. RN busy at this time and will call back

## 2019-11-19 NOTE — ED Notes (Signed)
Report called to Byrd Hesselbach, RN on third floor

## 2019-11-19 NOTE — Consult Note (Addendum)
CC: abdominal pain  Requesting provider: Dr. Darreld McleanVishal Patel  HPI: Ashlee Cobb is an 86 y.o. female who is here for abdominal pain. She reports the sudden onset of diffuse abdominal pain between 10 and 11 AM this morning.  He described the pain as sharp and constant.  She presented to her primary care provider who then sent her by EMS to the emergency department.  She denies nausea or vomiting.  She reports the pain is persisted.  Bowel movements have been normal.  She denies fevers or chills.  She denies dysuria.  She underwent a CT scan showing a possible partial small bowel obstruction.  She has no previous history of bowel obstructions.  She has had a hysterectomy in the past.  Past Medical History:  Diagnosis Date  . Asthma   . COPD (chronic obstructive pulmonary disease) (HCC)   . Hyperlipidemia   . Hypertension     Past Surgical History:  Procedure Laterality Date  . ABDOMINAL HYSTERECTOMY      Family History  Problem Relation Age of Onset  . Cirrhosis Father     Social:  reports that she has been smoking cigarettes. She has a 60.00 pack-year smoking history. She has never used smokeless tobacco. She reports previous alcohol use of about 1.0 standard drink of alcohol per week. She reports that she does not use drugs.  Allergies: No Known Allergies  Medications: I have reviewed the patient's current medications.  Results for orders placed or performed during the hospital encounter of 07-05-2019 (from the past 48 hour(s))  Comprehensive metabolic panel     Status: Abnormal   Collection Time: 07-05-2019  5:03 PM  Result Value Ref Range   Sodium 140 135 - 145 mmol/L   Potassium 4.7 3.5 - 5.1 mmol/L   Chloride 103 98 - 111 mmol/L   CO2 24 22 - 32 mmol/L   Glucose, Bld 123 (H) 70 - 99 mg/dL    Comment: Glucose reference range applies only to samples taken after fasting for at least 8 hours.   BUN 30 (H) 8 - 23 mg/dL   Creatinine, Ser 1.611.11 (H) 0.44 - 1.00 mg/dL   Calcium 9.4  8.9 - 09.610.3 mg/dL   Total Protein 7.4 6.5 - 8.1 g/dL   Albumin 4.2 3.5 - 5.0 g/dL   AST 26 15 - 41 U/L   ALT 14 0 - 44 U/L   Alkaline Phosphatase 63 38 - 126 U/L   Total Bilirubin 0.7 0.3 - 1.2 mg/dL   GFR, Estimated 42 (L) >60 mL/min   Anion gap 13 5 - 15    Comment: Performed at [facility], [address] [place], [place] [id number]  CBC with Differential     Status: Abnormal   Collection Time: 07-05-2019  5:03 PM  Result Value Ref Range   WBC 11.0 (H) 4.0 - 10.5 K/uL   RBC 3.82 (L) 3.87 - 5.11 MIL/uL   Hemoglobin 11.4 (L) 12.0 - 15.0 g/dL   HCT 98.136.6 36 - 46 %   MCV 95.8 80.0 - 100.0 fL   MCH 29.8 26.0 - 34.0 pg   MCHC 31.1 30.0 - 36.0 g/dL   RDW 19.114.9 47.811.5 - 29.515.5 %   Platelets 316 150 - 400 K/uL   nRBC 0.0 0.0 - 0.2 %   Neutrophils Relative % 82 %   Neutro Abs 9.0 (H) 1.7 - 7.7 K/uL   Lymphocytes Relative 13 %   Lymphs Abs 1.4 0.7 - 4.0  K/uL   Monocytes Relative 5 %   Monocytes Absolute 0.5 0.1 - 1.0 K/uL   Eosinophils Relative 0 %   Eosinophils Absolute 0.0 0.0 - 0.5 K/uL   Basophils Relative 0 %   Basophils Absolute 0.0 0.0 - 0.1 K/uL   Immature Granulocytes 0 %   Abs Immature Granulocytes 0.03 0.00 - 0.07 K/uL    Comment: Performed at [facility], [address], [place], [place] [address]  Lipase, blood     Status: None   Collection Time: November 25, 2019  5:03 PM  Result Value Ref Range   Lipase 35 11 - 51 U/L    Comment: Performed at [facility], [address], [place], [place] [address]  Lactic acid, plasma     Status: None   Collection Time: Nov 25, 2019  5:03 PM  Result Value Ref Range   Lactic Acid, Venous 1.5 0.5 - 1.9 mmol/L    Comment: Performed at [facility], [address] [place], [place] [address]  Troponin I (High Sensitivity)     Status: None   Collection Time: 2019-11-25  5:03 PM  Result Value Ref Range   Troponin I (High Sensitivity) 8 <18 ng/L    Comment: (NOTE) Elevated high  sensitivity troponin I (hsTnI) values and significant  changes across serial measurements may suggest ACS but many other  chronic and acute conditions are known to elevate hsTnI results.  Refer to the "Links" section for chest pain algorithms and additional  guidance. Performed at [facility], [address], [place], [place] [address]   I-stat chem 8, ED (not at [facility] or [facility])     Status: Abnormal   Collection Time: 25-Nov-2019  5:12 PM  Result Value Ref Range   Sodium 138 135 - 145 mmol/L   Potassium 5.1 3.5 - 5.1 mmol/L   Chloride 104 98 - 111 mmol/L   BUN 36 (H) 8 - 23 mg/dL   Creatinine, Ser 2.09 (H) 0.44 - 1.00 mg/dL   Glucose, Bld 470 (H) 70 - 99 mg/dL    Comment: Glucose reference range applies only to samples taken after fasting for at least 8 hours.   Calcium, Ion 1.18 1.15 - 1.40 mmol/L   TCO2 28 22 - 32 mmol/L   Hemoglobin 12.2 12.0 - 15.0 g/dL   HCT 96.2 36 - 46 %  Respiratory Panel by RT PCR (Flu A&B, Covid) - Nasopharyngeal Swab     Status: None   Collection Time: 2019/11/25  5:22 PM   Specimen: Nasopharyngeal Swab  Result Value Ref Range   SARS Coronavirus 2 by RT PCR NEGATIVE NEGATIVE    Comment: (NOTE) SARS-CoV-2 target nucleic acids are NOT DETECTED.  The SARS-CoV-2 RNA is generally detectable in upper respiratoy specimens during the acute phase of infection. The lowest concentration of SARS-CoV-2 viral copies this assay can detect is 131 copies/mL. A negative result does not preclude SARS-Cov-2 infection and should not be used as the sole basis for treatment or other patient management decisions. A negative result may occur with  improper specimen collection/handling, submission of specimen other than nasopharyngeal swab, presence of viral mutation(s) within the areas targeted by this assay, and inadequate number of viral copies (<131 copies/mL). A negative result must be combined with clinical observations, patient history, and epidemiological  information. The expected result is Negative.  Fact Sheet for Patients:  https://www.moore.com/  Fact Sheet for Healthcare Providers:  https://www.young.biz/  This test is no t yet approved  or cleared by the [place] and  has been authorized for detection and/or diagnosis of SARS-CoV-2 by FDA under an Emergency Use Authorization (EUA). This EUA will remain  in effect (meaning this test can be used) for the duration of the COVID-19 declaration under Section 564(b)(1) of the Act, 21 U.S.C. section 360bbb-3(b)(1), unless the authorization is terminated or revoked sooner.     Influenza A by PCR NEGATIVE NEGATIVE   Influenza B by PCR NEGATIVE NEGATIVE    Comment: (NOTE) The Xpert Xpress SARS-CoV-2/FLU/RSV assay is intended as an aid in  the diagnosis of influenza from Nasopharyngeal swab specimens and  should not be used as a sole basis for treatment. Nasal washings and  aspirates are unacceptable for Xpert Xpress SARS-CoV-2/FLU/RSV  testing.  Fact Sheet for Patients: https://www.moore.com/  Fact Sheet for Healthcare Providers: https://www.young.biz/  This test is not yet approved or cleared by the [place] FDA and  has been authorized for detection and/or diagnosis of SARS-CoV-2 by  FDA under an Emergency Use Authorization (EUA). This EUA will remain  in effect (meaning this test can be used) for the duration of the  Covid-19 declaration under Section 564(b)(1) of the Act, 21  U.S.C. section 360bbb-3(b)(1), unless the authorization is  terminated or revoked. Performed at [facility], [address] [place], [place] [address]   Urinalysis, Routine w reflex microscopic Urine, Catheterized     Status: Abnormal   Collection Time: Nov 24, 2019  5:43 PM  Result Value Ref Range   Color, Urine YELLOW YELLOW   APPearance CLEAR CLEAR   Specific Gravity, Urine 1.020 1.005 -  1.030   pH 5.0 5.0 - 8.0   Glucose, UA NEGATIVE NEGATIVE mg/dL   Hgb urine dipstick NEGATIVE NEGATIVE   Bilirubin Urine NEGATIVE NEGATIVE   Ketones, ur 5 (A) NEGATIVE mg/dL   Protein, ur NEGATIVE NEGATIVE mg/dL   Nitrite NEGATIVE NEGATIVE   Leukocytes,Ua TRACE (A) NEGATIVE   RBC / HPF 0-5 0 - 5 RBC/hpf   WBC, UA 6-10 0 - 5 WBC/hpf   Bacteria, UA NONE SEEN NONE SEEN   Mucus PRESENT     Comment: Performed at [facility], [address] [place], [place] [address]  Type and screen [facility]     Status: None   Collection Time: 11-24-2019  5:53 PM  Result Value Ref Range   ABO/RH(D) O POS    Antibody Screen NEG    Sample Expiration      11/22/2019,2359 Performed at [facility], [address], [place], [place] [address]     CT ABDOMEN PELVIS W CONTRAST  Result Date: 2019-11-24 CLINICAL DATA:  Acute lower abdominal pain none EXAM: CT ABDOMEN AND PELVIS WITH CONTRAST TECHNIQUE: Multidetector CT imaging of the abdomen and pelvis was performed using the standard protocol following bolus administration of intravenous contrast. CONTRAST:  OMNIPAQUE IOHEXOL 300 MG/ML  SOLN COMPARISON:  None. FINDINGS: Lower chest: The visualized heart size within normal limits. No pericardial fluid/thickening. A small hiatal hernia is present with fluid reflux in the distal esophagus. The visualized portions of the lungs are clear. Hepatobiliary: The liver is normal in density without focal abnormality.The main portal vein is patent. The patient is status post cholecystectomy. Pancreas: Unremarkable. No pancreatic ductal dilatation or surrounding inflammatory changes. Spleen: Normal in size without focal abnormality. Adrenals/Urinary Tract: Both adrenal glands appear normal. Multiple bilateral low-density lesions are seen throughout kidneys likely renal cysts the largest in lower  pole the right kidney measures 5.2 cm. There is mild atrophy. There is a  punctate calcification in the pole the right kidney. Probable bilateral renal vascular calcifications are. The bladder is. Stomach/Bowel: As above there is a small hiatal hernia present. The small is unremarkable. Within the mid ileal loops in the left lower quadrant there is mildly dilated air and fluid-filled measuring up to 3 cm significant surrounding mesenteric edema and amount loculated fluid. There is a focal area narrowing seen within the left mid abdomen best seen series 2, image 49. There remainder the distal loops decompressed. There is a moderate amount stool present throughout. Vascular/Lymphatic: There are no enlarged mesenteric, retroperitoneal, or pelvic lymph nodes. Scattered aortic atherosclerotic calcifications are seen without aneurysmal dilatation. Reproductive: The patient is status post hysterectomy. No adnexal masses or collections seen. Other: A small amount of abdominopelvic ascites is seen. No abdominal wall hernia is noted. Musculoskeletal: No acute or significant osseous findings. A healed fracture deformity seen within the inferior left pubic rami. IMPRESSION: Findings consistent with a partial small bowel obstruction within the left mid abdomen within the mid ileal loops with a focal area of narrowing as described. Small amount abdominopelvic ascites Hiatal hernia with gastroesophageal reflux Aortic Atherosclerosis (ICD10-I70.0). Electronically Signed   By: Jonna Clark M.D.   On: 12/19/2019 19:05   DG Chest Portable 1 View  Result Date: 12-19-19 CLINICAL DATA:  Abdominal pain EXAM: PORTABLE CHEST 1 VIEW COMPARISON:  November 02, 2010 FINDINGS: The heart size and mediastinal contours are within normal limits. Aortic knob calcifications are seen. Stable calcifications seen along the right upper paratracheal stripe. Both lungs are clear. Again noted is advanced left shoulder osteoarthritis. There is diffuse osteopenia. IMPRESSION: No active disease. Electronically Signed   By:  Jonna Clark M.D.   On: 12/19/2019 17:39    ROS - all of the below systems have been reviewed with the patient and positives are indicated with bold text General: chills, fever or night sweats Eyes: blurry vision or double vision ENT: epistaxis or sore throat Allergy/Immunology: itchy/watery eyes or nasal congestion Hematologic/Lymphatic: bleeding problems, blood clots or swollen lymph nodes Endocrine: temperature intolerance or unexpected weight changes Breast: new or changing breast lumps or nipple discharge Resp: cough, shortness of breath, or wheezing CV: chest pain or dyspnea on exertion GI: as per HPI GU: dysuria, trouble voiding, or hematuria MSK: joint pain or joint stiffness Neuro: TIA or stroke symptoms Derm: pruritus and skin lesion changes Psych: anxiety and depression  PE Blood pressure (!) 134/103, pulse 87, temperature 97.8 F (36.6 C), temperature source Oral, resp. rate (!) 22, SpO2 99 %. Constitutional: NAD; conversant; no deformities, she is hard of hearing Eyes: Moist conjunctiva; no lid lag; anicteric; PERRL Neck: Trachea midline; no thyromegaly Lungs: Normal respiratory effort; no tactile fremitus CV: RRR; no palpable thrills; no pitting edema GI: Abd her abdomen is mildly distended.  There is tenderness with guarding in the left abdomen and left lower quadrant.; no palpable hepatosplenomegaly, she has a well-healed midline incision with no obvious hernia. MSK: Normal range of motion of extremities; no clubbing/cyanosis Psychiatric: Appropriate affect; alert and oriented x3 Lymphatic: No palpable cervical or axillary lymphadenopathy  Results for orders placed or performed during the hospital encounter of Dec 19, 2019 (from the past 48 hour(s))  Comprehensive metabolic panel     Status: Abnormal   Collection Time: 12-19-19  5:03 PM  Result Value Ref Range   Sodium 140 135 - 145 mmol/L   Potassium 4.7 3.5 -  5.1 mmol/L   Chloride 103 98 - 111 mmol/L   CO2 24 22  - 32 mmol/L   Glucose, Bld 123 (H) 70 - 99 mg/dL    Comment: Glucose reference range applies only to samples taken after fasting for at least 8 hours.   BUN 30 (H) 8 - 23 mg/dL   Creatinine, Ser 1.61 (H) 0.44 - 1.00 mg/dL   Calcium 9.4 8.9 - 09.6 mg/dL   Total Protein 7.4 6.5 - 8.1 g/dL   Albumin 4.2 3.5 - 5.0 g/dL   AST 26 15 - 41 U/L   ALT 14 0 - 44 U/L   Alkaline Phosphatase 63 38 - 126 U/L   Total Bilirubin 0.7 0.3 - 1.2 mg/dL   GFR, Estimated 42 (L) >60 mL/min   Anion gap 13 5 - 15    Comment: Performed at [facility], [address] [place], [place] [address]  CBC with Differential     Status: Abnormal   Collection Time: 11/22/2019  5:03 PM  Result Value Ref Range   WBC 11.0 (H) 4.0 - 10.5 K/uL   RBC 3.82 (L) 3.87 - 5.11 MIL/uL   Hemoglobin 11.4 (L) 12.0 - 15.0 g/dL   HCT 98.1 36 - 46 %   MCV 95.8 80.0 - 100.0 fL   MCH 29.8 26.0 - 34.0 pg   MCHC 31.1 30.0 - 36.0 g/dL   RDW 19.1 47.8 - 29.5 %   Platelets 316 150 - 400 K/uL   nRBC 0.0 0.0 - 0.2 %   Neutrophils Relative % 82 %   Neutro Abs 9.0 (H) 1.7 - 7.7 K/uL   Lymphocytes Relative 13 %   Lymphs Abs 1.4 0.7 - 4.0 K/uL   Monocytes Relative 5 %   Monocytes Absolute 0.5 0.1 - 1.0 K/uL   Eosinophils Relative 0 %   Eosinophils Absolute 0.0 0.0 - 0.5 K/uL   Basophils Relative 0 %   Basophils Absolute 0.0 0.0 - 0.1 K/uL   Immature Granulocytes 0 %   Abs Immature Granulocytes 0.03 0.00 - 0.07 K/uL    Comment: Performed at [facility], [address], [place], [place] [address]  Lipase, blood     Status: None   Collection Time: 12/05/2019  5:03 PM  Result Value Ref Range   Lipase 35 11 - 51 U/L    Comment: Performed at [facility], [address] [place], [place] [address]  Lactic acid, plasma     Status: None   Collection Time: 12/04/2019  5:03 PM  Result Value Ref Range   Lactic Acid, Venous 1.5 0.5 - 1.9 mmol/L    Comment: Performed at [facility], [address] [place], [place] [address]  Troponin I (High Sensitivity)     Status: None   Collection Time: 11/21/2019  5:03 PM  Result Value Ref Range   Troponin I (High Sensitivity) 8 <18 ng/L    Comment: (NOTE) Elevated high sensitivity troponin I (hsTnI) values and significant  changes across serial measurements may suggest ACS but many other  chronic and acute conditions are known to elevate hsTnI results.  Refer to the "Links" section for chest pain algorithms and additional  guidance. Performed at [facility], [address], [place], [place] [address]   I-stat chem 8, ED (not at [facility] or [facility])     Status: Abnormal   Collection Time: 11/18/2019  5:12 PM  Result Value Ref Range  Sodium 138 135 - 145 mmol/L   Potassium 5.1 3.5 - 5.1 mmol/L   Chloride 104 98 - 111 mmol/L   BUN 36 (H) 8 - 23 mg/dL   Creatinine, Ser 6.78 (H) 0.44 - 1.00 mg/dL   Glucose, Bld 938 (H) 70 - 99 mg/dL    Comment: Glucose reference range applies only to samples taken after fasting for at least 8 hours.   Calcium, Ion 1.18 1.15 - 1.40 mmol/L   TCO2 28 22 - 32 mmol/L   Hemoglobin 12.2 12.0 - 15.0 g/dL   HCT 10.1 36 - 46 %  Respiratory Panel by RT PCR (Flu A&B, Covid) - Nasopharyngeal Swab     Status: None   Collection Time: 11/18/2019  5:22 PM   Specimen: Nasopharyngeal Swab  Result Value Ref Range   SARS Coronavirus 2 by RT PCR NEGATIVE NEGATIVE    Comment: (NOTE) SARS-CoV-2 target nucleic acids are NOT DETECTED.  The SARS-CoV-2 RNA is generally detectable in upper respiratoy specimens during the acute phase of infection. The lowest concentration of SARS-CoV-2 viral copies this assay can detect is 131 copies/mL. A negative result does not preclude SARS-Cov-2 infection and should not be used as the sole basis for treatment or other patient management decisions. A negative result may occur with  improper specimen collection/handling, submission of specimen other than  nasopharyngeal swab, presence of viral mutation(s) within the areas targeted by this assay, and inadequate number of viral copies (<131 copies/mL). A negative result must be combined with clinical observations, patient history, and epidemiological information. The expected result is Negative.  Fact Sheet for Patients:  https://www.moore.com/  Fact Sheet for Healthcare Providers:  https://www.young.biz/  This test is no t yet approved or cleared by the [place] FDA and  has been authorized for detection and/or diagnosis of SARS-CoV-2 by FDA under an Emergency Use Authorization (EUA). This EUA will remain  in effect (meaning this test can be used) for the duration of the COVID-19 declaration under Section 564(b)(1) of the Act, 21 U.S.C. section 360bbb-3(b)(1), unless the authorization is terminated or revoked sooner.     Influenza A by PCR NEGATIVE NEGATIVE   Influenza B by PCR NEGATIVE NEGATIVE    Comment: (NOTE) The Xpert Xpress SARS-CoV-2/FLU/RSV assay is intended as an aid in  the diagnosis of influenza from Nasopharyngeal swab specimens and  should not be used as a sole basis for treatment. Nasal washings and  aspirates are unacceptable for Xpert Xpress SARS-CoV-2/FLU/RSV  testing.  Fact Sheet for Patients: https://www.moore.com/  Fact Sheet for Healthcare Providers: https://www.young.biz/  This test is not yet approved or cleared by the [place] FDA and  has been authorized for detection and/or diagnosis of SARS-CoV-2 by  FDA under an Emergency Use Authorization (EUA). This EUA will remain  in effect (meaning this test can be used) for the duration of the  Covid-19 declaration under Section 564(b)(1) of the Act, 21  U.S.C. section 360bbb-3(b)(1), unless the authorization is  terminated or revoked. Performed at [facility], [address] [place], [place]  [address]   Urinalysis, Routine w reflex microscopic Urine, Catheterized     Status: Abnormal   Collection Time: 11/09/2019  5:43 PM  Result Value Ref Range   Color, Urine YELLOW YELLOW   APPearance CLEAR CLEAR   Specific Gravity, Urine 1.020 1.005 - 1.030   pH 5.0 5.0 - 8.0   Glucose, UA NEGATIVE NEGATIVE mg/dL   Hgb urine dipstick NEGATIVE NEGATIVE   Bilirubin  Urine NEGATIVE NEGATIVE   Ketones, ur 5 (A) NEGATIVE mg/dL   Protein, ur NEGATIVE NEGATIVE mg/dL   Nitrite NEGATIVE NEGATIVE   Leukocytes,Ua TRACE (A) NEGATIVE   RBC / HPF 0-5 0 - 5 RBC/hpf   WBC, UA 6-10 0 - 5 WBC/hpf   Bacteria, UA NONE SEEN NONE SEEN   Mucus PRESENT     Comment: Performed at [facility], [address], [place], [place] [address]  Type and screen [facility]     Status: None   Collection Time: 11/26/19  5:53 PM  Result Value Ref Range   ABO/RH(D) O POS    Antibody Screen NEG    Sample Expiration      11/22/2019,2359 Performed at [facility], [address], [place], [place] [address]     CT ABDOMEN PELVIS W CONTRAST  Result Date: 11-26-2019 CLINICAL DATA:  Acute lower abdominal pain none EXAM: CT ABDOMEN AND PELVIS WITH CONTRAST TECHNIQUE: Multidetector CT imaging of the abdomen and pelvis was performed using the standard protocol following bolus administration of intravenous contrast. CONTRAST:  OMNIPAQUE IOHEXOL 300 MG/ML  SOLN COMPARISON:  None. FINDINGS: Lower chest: The visualized heart size within normal limits. No pericardial fluid/thickening. A small hiatal hernia is present with fluid reflux in the distal esophagus. The visualized portions of the lungs are clear. Hepatobiliary: The liver is normal in density without focal abnormality.The main portal vein is patent. The patient is status post cholecystectomy. Pancreas: Unremarkable. No pancreatic ductal dilatation or surrounding inflammatory changes. Spleen: Normal in size without focal  abnormality. Adrenals/Urinary Tract: Both adrenal glands appear normal. Multiple bilateral low-density lesions are seen throughout kidneys likely renal cysts the largest in lower pole the right kidney measures 5.2 cm. There is mild atrophy. There is a punctate calcification in the pole the right kidney. Probable bilateral renal vascular calcifications are. The bladder is. Stomach/Bowel: As above there is a small hiatal hernia present. The small is unremarkable. Within the mid ileal loops in the left lower quadrant there is mildly dilated air and fluid-filled measuring up to 3 cm significant surrounding mesenteric edema and amount loculated fluid. There is a focal area narrowing seen within the left mid abdomen best seen series 2, image 49. There remainder the distal loops decompressed. There is a moderate amount stool present throughout. Vascular/Lymphatic: There are no enlarged mesenteric, retroperitoneal, or pelvic lymph nodes. Scattered aortic atherosclerotic calcifications are seen without aneurysmal dilatation. Reproductive: The patient is status post hysterectomy. No adnexal masses or collections seen. Other: A small amount of abdominopelvic ascites is seen. No abdominal wall hernia is noted. Musculoskeletal: No acute or significant osseous findings. A healed fracture deformity seen within the inferior left pubic rami. IMPRESSION: Findings consistent with a partial small bowel obstruction within the left mid abdomen within the mid ileal loops with a focal area of narrowing as described. Small amount abdominopelvic ascites Hiatal hernia with gastroesophageal reflux Aortic Atherosclerosis (ICD10-I70.0). Electronically Signed   By: Jonna Clark M.D.   On: 26-Nov-2019 19:05   DG Chest Portable 1 View  Result Date: 26-Nov-2019 CLINICAL DATA:  Abdominal pain EXAM: PORTABLE CHEST 1 VIEW COMPARISON:  November 02, 2010 FINDINGS: The heart size and mediastinal contours are within normal limits. Aortic knob  calcifications are seen. Stable calcifications seen along the right upper paratracheal stripe. Both lungs are clear. Again noted is advanced left shoulder osteoarthritis. There is diffuse osteopenia. IMPRESSION: No active disease. Electronically Signed   By: Kandis Fantasia  Avutu M.D.   On: 11/17/2019 17:39     A/P: Partial small bowel obstruction  I have discussed the diagnosis with the patient and another person who is with her in the room.  This may be a partial obstruction from adhesions.  I cannot completely rule out a closed-loop obstruction.  It is reassuring that her lactic acid level is normal.  I recommend an attempt at conservative management with bowel rest, nasogastric decompression, and IV rehydration.  We will repeat her abdominal x-rays in the morning.  We will consider starting the small bowel protocol in the morning if she is not improving or if she is worse and needs an exploratory laparotomy.  She agrees with the plan.   Abigail Miyamoto, MD [facility]

## 2019-11-19 NOTE — ED Triage Notes (Signed)
Was sent by PCP for abdominal pain that started suddenly at 10am this morning. Denies N/V/D or constipation. Ems gave fentanyl in route

## 2019-11-19 NOTE — H&P (Signed)
History and Physical    Ashlee Cobb Ashlee Cobb DOB: 1923/07/01 DOA: 11/18/2019  PCP: Gweneth Dimitri, MD  Patient coming from: [facility]  I have personally briefly reviewed patient's old medical records in [facility]  Chief Complaint: Abdominal pain  HPI: Ashlee Cobb is a 86 y.o. female with medical history significant for hypertension, hyperlipidemia who presents to the ED for evaluation of new onset abdominal pain.  Patient reports new onset of sudden and severe abdominal pain across her mid abdomen occurring around 10 AM. She denies any associated nausea, vomiting, or diarrhea. She reports last bowel movement which appeared normal on 10/14. She says she is passing gas today. She denies any subjective fevers, chills, chest pain, dyspnea, cough, or dysuria. She is having persistent pain. She reports prior history of abdominal hysterectomy.  ED Course:  Initial vitals showed BP 128/58, pulse 58, RR 20, temp 97.8 Fahrenheit, SPO2 100% on room air.  Labs notable for WBC 11.0, hemoglobin 11.4, platelets 316,000, sodium 140, potassium 4.7, bicarb 24, BUN 30, creatinine 1.11, serum glucose 123, LFTs within normal limits, lactic acid 1.5, lipase 35, high-sensitivity troponin I 8.  Urinalysis is negative for UTI.  SARS-CoV-2 PCR is negative.  Influenza A/B PCR negative.  Portable chest x-ray is negative for focal consolidation, edema, or effusion.  CT abdomen/pelvis with contrast shows findings consistent with partial small bowel obstruction within the left midabdomen.  Patient was given IV Zofran and morphine 4 mg.  Patient had transient hypotension thought secondary to pain medication which improved after receiving 1 L LR and 1 L NS.  EDP discussed with on-call general surgery who recommended NG tube placement and medical admission.  Hospitalist service was consulted to admit for further evaluation and management.  Review of Systems: All systems reviewed and are negative  except as documented in history of present illness above.   Past Medical History:  Diagnosis Date  . Asthma   . COPD (chronic obstructive pulmonary disease) (HCC)   . Hyperlipidemia   . Hypertension     Past Surgical History:  Procedure Laterality Date  . ABDOMINAL HYSTERECTOMY      Social History:  reports that she has been smoking cigarettes. She has a 60.00 pack-year smoking history. She has never used smokeless tobacco. She reports previous alcohol use of about 1.0 standard drink of alcohol per week. She reports that she does not use drugs.  No Known Allergies  Family History  Problem Relation Age of Onset  . Cirrhosis Father      Prior to Admission medications   Medication Sig Start Date End Date Taking? Authorizing Provider  calcium carbonate (TUMS - DOSED IN MG ELEMENTAL CALCIUM) 500 MG chewable tablet Chew 1 tablet by mouth 2 (two) times daily.    [provider]  cholecalciferol (VITAMIN D) 1000 UNITS tablet Take 2,000 Units by mouth daily.     [provider]  fexofenadine (ALLEGRA) 180 MG tablet Take 180 mg by mouth daily as needed for allergies or rhinitis.    [provider]  lisinopril (PRINIVIL,ZESTRIL) 10 MG tablet Take 10 mg by mouth daily.    [provider]  Magnesium 250 MG TABS Take 1 tablet by mouth daily at 6 PM.     [provider]  mirtazapine (REMERON SOL-TAB) 15 MG disintegrating tablet DISSOLVE 1 TAB BY MOUTH DAILY AT 8PM 06/08/14   [provider]  Multiple Vitamins-Iron (MULTIVITAMINS WITH IRON) TABS tablet Take 1 tablet by mouth daily.  [provider]  simvastatin (ZOCOR) 20 MG tablet Take 20 mg by mouth at bedtime.     [provider]    Physical Exam: Vitals:   12/03/2019 1640 11/17/2019 1815 12/05/2019 1830 11/14/2019 1907  BP: (!) 128/58 100/80 94/82 (!) 125/105  Pulse: (!) 58 63 66 82  Resp: 20 (!) 23 (!) 23 18  Temp: 97.8 F (36.6 C)     TempSrc: Oral     SpO2: 100%  95% 96% 96%   Constitutional: Elderly woman resting supine in bed, appears uncomfortable Eyes: PERRL, lids and conjunctivae normal ENMT: NG tube in place right nares. Mucous membranes are moist. Posterior pharynx clear of any exudate or lesions.Normal dentition.  Neck: normal, supple, no masses. Respiratory: clear to auscultation bilaterally, no wheezing, no crackles. Normal respiratory effort. No accessory muscle use.  Cardiovascular: Regular rate and rhythm, no murmurs / rubs / gallops. No extremity edema. 2+ pedal pulses. Abdomen: Mildly distended abdomen with slight generalized tenderness, no masses palpated. No hepatosplenomegaly. Bowel sounds diminished. Well-healed midline surgical scar present. Musculoskeletal: no clubbing / cyanosis. No joint deformity upper and lower extremities. Good ROM, no contractures. Normal muscle tone.  Skin: no rashes, lesions, ulcers. No induration Neurologic: CN 2-12 grossly intact. Sensation intact, Strength 5/5 in all 4.  Psychiatric: Normal judgment and insight. Alert and oriented x 3. Normal mood.    Labs on Admission: I have personally reviewed following labs and imaging studies  CBC: Recent Labs  Lab 12/04/2019 1703 11/30/2019 1712  WBC 11.0*  --   NEUTROABS 9.0*  --   HGB 11.4* 12.2  HCT 36.6 36.0  MCV 95.8  --   PLT 316  --    Basic Metabolic Panel: Recent Labs  Lab 11/18/2019 1703 11/10/2019 1712  NA 140 138  K 4.7 5.1  CL 103 104  CO2 24  --   GLUCOSE 123* 119*  BUN 30* 36*  CREATININE 1.11* 1.10*  CALCIUM 9.4  --    GFR: CrCl cannot be calculated (Unknown ideal weight.). Liver Function Tests: Recent Labs  Lab 12/03/2019 1703  AST 26  ALT 14  ALKPHOS 63  BILITOT 0.7  PROT 7.4  ALBUMIN 4.2   Recent Labs  Lab 12/04/2019 1703  LIPASE 35   No results for input(s): AMMONIA in the last 168 hours. Coagulation Profile: No results for input(s): INR, PROTIME in the last 168 hours. Cardiac Enzymes: No results for input(s):  CKTOTAL, CKMB, CKMBINDEX, TROPONINI in the last 168 hours. BNP (last 3 results) No results for input(s): PROBNP in the last 8760 hours. HbA1C: No results for input(s): HGBA1C in the last 72 hours. CBG: No results for input(s): GLUCAP in the last 168 hours. Lipid Profile: No results for input(s): CHOL, HDL, LDLCALC, TRIG, CHOLHDL, LDLDIRECT in the last 72 hours. Thyroid Function Tests: No results for input(s): TSH, T4TOTAL, FREET4, T3FREE, THYROIDAB in the last 72 hours. Anemia Panel: No results for input(s): VITAMINB12, FOLATE, FERRITIN, TIBC, IRON, RETICCTPCT in the last 72 hours. Urine analysis:    Component Value Date/Time   COLORURINE YELLOW 11/22/2019 1743   APPEARANCEUR CLEAR 11/05/2019 1743   LABSPEC 1.020 11/26/2019 1743   PHURINE 5.0 11/16/2019 1743   GLUCOSEU NEGATIVE 11/20/2019 1743   HGBUR NEGATIVE 12/03/2019 1743   BILIRUBINUR NEGATIVE 11/16/2019 1743   KETONESUR 5 (A) 11/30/2019 1743   PROTEINUR NEGATIVE 12/05/2019 1743   NITRITE NEGATIVE 11/25/2019 1743   LEUKOCYTESUR TRACE (A) 11/25/2019 1743    Radiological Exams on Admission: CT  ABDOMEN PELVIS W CONTRAST  Result Date: 28-Nov-2019 CLINICAL DATA:  Acute lower abdominal pain none EXAM: CT ABDOMEN AND PELVIS WITH CONTRAST TECHNIQUE: Multidetector CT imaging of the abdomen and pelvis was performed using the standard protocol following bolus administration of intravenous contrast. CONTRAST:  OMNIPAQUE IOHEXOL 300 MG/ML  SOLN COMPARISON:  None. FINDINGS: Lower chest: The visualized heart size within normal limits. No pericardial fluid/thickening. A small hiatal hernia is present with fluid reflux in the distal esophagus. The visualized portions of the lungs are clear. Hepatobiliary: The liver is normal in density without focal abnormality.The main portal vein is patent. The patient is status post cholecystectomy. Pancreas: Unremarkable. No pancreatic ductal dilatation or surrounding inflammatory changes. Spleen:  Normal in size without focal abnormality. Adrenals/Urinary Tract: Both adrenal glands appear normal. Multiple bilateral low-density lesions are seen throughout kidneys likely renal cysts the largest in lower pole the right kidney measures 5.2 cm. There is mild atrophy. There is a punctate calcification in the pole the right kidney. Probable bilateral renal vascular calcifications are. The bladder is. Stomach/Bowel: As above there is a small hiatal hernia present. The small is unremarkable. Within the mid ileal loops in the left lower quadrant there is mildly dilated air and fluid-filled measuring up to 3 cm significant surrounding mesenteric edema and amount loculated fluid. There is a focal area narrowing seen within the left mid abdomen best seen series 2, image 49. There remainder the distal loops decompressed. There is a moderate amount stool present throughout. Vascular/Lymphatic: There are no enlarged mesenteric, retroperitoneal, or pelvic lymph nodes. Scattered aortic atherosclerotic calcifications are seen without aneurysmal dilatation. Reproductive: The patient is status post hysterectomy. No adnexal masses or collections seen. Other: A small amount of abdominopelvic ascites is seen. No abdominal wall hernia is noted. Musculoskeletal: No acute or significant osseous findings. A healed fracture deformity seen within the inferior left pubic rami. IMPRESSION: Findings consistent with a partial small bowel obstruction within the left mid abdomen within the mid ileal loops with a focal area of narrowing as described. Small amount abdominopelvic ascites Hiatal hernia with gastroesophageal reflux Aortic Atherosclerosis (ICD10-I70.0). Electronically Signed   By: Jonna Clark M.D.   On: 11-28-2019 19:05   DG Chest Portable 1 View  Result Date: Nov 28, 2019 CLINICAL DATA:  Abdominal pain EXAM: PORTABLE CHEST 1 VIEW COMPARISON:  November 02, 2010 FINDINGS: The heart size and mediastinal contours are within normal  limits. Aortic knob calcifications are seen. Stable calcifications seen along the right upper paratracheal stripe. Both lungs are clear. Again noted is advanced left shoulder osteoarthritis. There is diffuse osteopenia. IMPRESSION: No active disease. Electronically Signed   By: Jonna Clark M.D.   On: 11-28-2019 17:39    EKG: Personally reviewed. Right and left arm reversals, sinus rhythm.  When compared to prior PVC no longer present.  Assessment/Plan Principal Problem:   Partial small bowel obstruction (HCC) Active Problems:   Hyperlipidemia   Hypertension  Ashlee Cobb is a 86 y.o. female with medical history significant for hypertension, hyperlipidemia who is admitted with partial SBO.  Partial small bowel obstruction: Seen on CT imaging. General surgery following and recommend conservative management at this time. Question of obstruction from adhesions, closed-loop obstruction not ruled out per surgery. -NG tube placed for decompression -Keep n.p.o. -Placed on maintenance IV fluids with LR at 100 mL/HR overnight -Pain control with IV morphine as needed with hold parameters -Appreciate general surgery assistance  Hypertension: Currently stable. Holding lisinopril while NPO.  Hyperlipidemia: Simvastatin on  hold while NPO.  DVT prophylaxis: SCDs in case needs exploratory laparotomy Code Status: DNR, confirmed with patient Family Communication: Discussed with patient's sister at bedside Disposition Plan: From [facility], dispo pending improvement in SBO, further general surgery evaluation Consults called: General surgery Admission status:  Status is: Inpatient  Remains inpatient appropriate because:Ongoing active pain requiring inpatient pain management, IV treatments appropriate due to intensity of illness or inability to take PO and Inpatient level of care appropriate due to severity of illness   Dispo: The patient is from: ALF              Anticipated d/c is to: ALF               Anticipated d/c date is: 3 days              Patient currently is not medically stable to d/c.   Darreld Mclean MD [facility]  If 7PM-7AM, please contact night-coverage [email or web address]  12/02/2019, 7:58 PM

## 2019-11-19 NOTE — ED Provider Notes (Addendum)
[facility]-EMERGENCY DEPT Provider Note   CSN: [id number] Arrival date & time: 19-Dec-2019  1625     History Chief Complaint  Patient presents with  . Abdominal Pain    Ashlee Cobb is a 86 y.o. female.  HPI      Ashlee Cobb is a 86 y.o. female, with a history of asthma, COPD, hyperlipidemia, HTN, presenting to the ED with abdominal pain beginning around 10 AM this morning.  Pain is across the lower abdomen, 9/10, constant, vague description, nonradiating. She was seen at her PCPs office today and was sent by ambulance to the ED.  Last food was around 8 AM.  She is vaccinated for Covid.  Denies fever/chills, N/V/C/D, chest pain, shortness of breath, back pain, urinary symptoms, dizziness, syncope, hematochezia/melena, or any other complaints.   Past Medical History:  Diagnosis Date  . Asthma   . COPD (chronic obstructive pulmonary disease) (HCC)   . Hyperlipidemia   . Hypertension     Patient Active Problem List   Diagnosis Date Noted  . Syncope 07/17/2014    Past Surgical History:  Procedure Laterality Date  . ABDOMINAL HYSTERECTOMY       OB History   No obstetric history on file.     Family History  Problem Relation Age of Onset  . Cirrhosis Father     Social History   Tobacco Use  . Smoking status: Current Every Day Smoker    Packs/day: 1.00    Years: 60.00    Pack years: 60.00    Types: Cigarettes  . Smokeless tobacco: Never Used  Vaping Use  . Vaping Use: Never used  Substance Use Topics  . Alcohol use: Not Currently    Alcohol/week: 1.0 standard drink    Types: 1 Standard drinks or equivalent per week  . Drug use: Never    Home Medications Prior to Admission medications   Medication Sig Start Date End Date Taking? Authorizing Provider  calcium carbonate (TUMS - DOSED IN MG ELEMENTAL CALCIUM) 500 MG chewable tablet Chew 1 tablet by mouth 2 (two) times daily.    [provider]  cholecalciferol (VITAMIN  D) 1000 UNITS tablet Take 2,000 Units by mouth daily.     [provider]  fexofenadine (ALLEGRA) 180 MG tablet Take 180 mg by mouth daily as needed for allergies or rhinitis.    [provider]  lisinopril (PRINIVIL,ZESTRIL) 10 MG tablet Take 10 mg by mouth daily.    [provider]  Magnesium 250 MG TABS Take 1 tablet by mouth daily at 6 PM.     [provider]  mirtazapine (REMERON SOL-TAB) 15 MG disintegrating tablet DISSOLVE 1 TAB BY MOUTH DAILY AT 8PM 06/08/14   [provider]  Multiple Vitamins-Iron (MULTIVITAMINS WITH IRON) TABS tablet Take 1 tablet by mouth daily.    [provider]  simvastatin (ZOCOR) 20 MG tablet Take 20 mg by mouth at bedtime.     [provider]    Allergies    Patient has no known allergies.  Review of Systems   Review of Systems  Constitutional: Negative for chills, diaphoresis and fever.  Respiratory: Negative for shortness of breath.   Cardiovascular: Negative for chest pain and leg swelling.  Gastrointestinal: Positive for abdominal pain. Negative for blood in stool, constipation, diarrhea, nausea and vomiting.  Genitourinary: Negative for difficulty urinating, dysuria and frequency.  Neurological: Negative for dizziness, syncope and weakness.  All other systems reviewed and are negative.  Physical Exam Updated Vital Signs BP (!) 128/58 (BP Location: Right Arm)   Pulse (!) 58   Temp 97.8 F (36.6 C) (Oral)   Resp 20   SpO2 100%   Physical Exam Vitals and nursing note reviewed.  Constitutional:      General: She is in acute distress (pain).     Appearance: She is well-developed. She is not diaphoretic.  HENT:     Head: Normocephalic and atraumatic.     Mouth/Throat:     Mouth: Mucous membranes are moist.     Pharynx: Oropharynx is clear.  Eyes:     Conjunctiva/sclera: Conjunctivae normal.  Cardiovascular:     Rate and Rhythm: Normal rate and regular rhythm.     Pulses:  Normal pulses.          Radial pulses are 2+ on the right side and 2+ on the left side.       Posterior tibial pulses are 2+ on the right side and 2+ on the left side.     Heart sounds: Normal heart sounds.     Comments: Tactile temperature in the extremities appropriate and equal bilaterally. Pulmonary:     Effort: Pulmonary effort is normal. No respiratory distress.     Breath sounds: Normal breath sounds.  Abdominal:     Palpations: Abdomen is soft.     Tenderness: There is abdominal tenderness. There is no guarding.    Musculoskeletal:     Cervical back: Neck supple.     Right lower leg: No edema.     Left lower leg: No edema.  Skin:    General: Skin is warm and dry.  Neurological:     Mental Status: She is alert.  Psychiatric:        Mood and Affect: Mood and affect normal.        Speech: Speech normal.        Behavior: Behavior normal.     ED Results / Procedures / Treatments   Labs (all labs ordered are listed, but only abnormal results are displayed) Labs Reviewed  COMPREHENSIVE METABOLIC PANEL - Abnormal; Notable for the following components:      Result Value   Glucose, Bld 123 (*)    BUN 30 (*)    Creatinine, Ser 1.11 (*)    GFR, Estimated 42 (*)    All other components within normal limits  CBC WITH DIFFERENTIAL/PLATELET - Abnormal; Notable for the following components:   WBC 11.0 (*)    RBC 3.82 (*)    Hemoglobin 11.4 (*)    Neutro Abs 9.0 (*)    All other components within normal limits  URINALYSIS, ROUTINE W REFLEX MICROSCOPIC - Abnormal; Notable for the following components:   Ketones, ur 5 (*)    Leukocytes,Ua TRACE (*)    All other components within normal limits  I-STAT CHEM 8, ED - Abnormal; Notable for the following components:   BUN 36 (*)    Creatinine, Ser 1.10 (*)    Glucose, Bld 119 (*)    All other components within normal limits  RESPIRATORY PANEL BY RT PCR (FLU A&B, COVID)  LIPASE, BLOOD  LACTIC ACID, PLASMA  LACTIC ACID, PLASMA    TYPE AND SCREEN  TROPONIN I (HIGH SENSITIVITY)  TROPONIN I (HIGH SENSITIVITY)   Hemoglobin  Date Value Ref Range Status  01-Dec-2019 12.2 12.0 - 15.0 g/dL Final  73/22/0254 27.0 (L) 12.0 - 15.0 g/dL Final  62/37/6283 15.1 (L) 12.0 - 15.0 g/dL Final  07/18/2014 11.1 (L) 12.0 - 15.0 g/dL Final   BUN  Date Value Ref Range Status  11/25/2019 36 (H) 8 - 23 mg/dL Final  16/08/3708 30 (H) 8 - 23 mg/dL Final  62/69/4854 30 (H) 8 - 23 mg/dL Final  62/70/3500 18 6 - 20 mg/dL Final   Creatinine, Ser  Date Value Ref Range Status  11/14/2019 1.10 (H) 0.44 - 1.00 mg/dL Final  93/81/8299 3.71 (H) 0.44 - 1.00 mg/dL Final  69/67/8938 1.01 (H) 0.44 - 1.00 mg/dL Final  75/11/2583 2.77 0.44 - 1.00 mg/dL Final      EKG EKG Interpretation  Date/Time:  Friday November 19 2019 16:44:49 EDT Ventricular Rate:  54 PR Interval:    QRS Duration: 94 QT Interval:  404 QTC Calculation: 383 R Axis:   110 Text Interpretation: Right and left arm electrode reversal, interpretation assumes no reversal Sinus rhythm Right axis deviation Nonspecific repol abnormality, diffuse leads Confirmed by Kristine Royal [phone]) on 11/10/2019 4:47:21 PM   Radiology CT ABDOMEN PELVIS W CONTRAST  Result Date: 11/05/2019 CLINICAL DATA:  Acute lower abdominal pain none EXAM: CT ABDOMEN AND PELVIS WITH CONTRAST TECHNIQUE: Multidetector CT imaging of the abdomen and pelvis was performed using the standard protocol following bolus administration of intravenous contrast. CONTRAST:  OMNIPAQUE IOHEXOL 300 MG/ML  SOLN COMPARISON:  None. FINDINGS: Lower chest: The visualized heart size within normal limits. No pericardial fluid/thickening. A small hiatal hernia is present with fluid reflux in the distal esophagus. The visualized portions of the lungs are clear. Hepatobiliary: The liver is normal in density without focal abnormality.The main portal vein is patent. The patient is status post cholecystectomy. Pancreas:  Unremarkable. No pancreatic ductal dilatation or surrounding inflammatory changes. Spleen: Normal in size without focal abnormality. Adrenals/Urinary Tract: Both adrenal glands appear normal. Multiple bilateral low-density lesions are seen throughout kidneys likely renal cysts the largest in lower pole the right kidney measures 5.2 cm. There is mild atrophy. There is a punctate calcification in the pole the right kidney. Probable bilateral renal vascular calcifications are. The bladder is. Stomach/Bowel: As above there is a small hiatal hernia present. The small is unremarkable. Within the mid ileal loops in the left lower quadrant there is mildly dilated air and fluid-filled measuring up to 3 cm significant surrounding mesenteric edema and amount loculated fluid. There is a focal area narrowing seen within the left mid abdomen best seen series 2, image 49. There remainder the distal loops decompressed. There is a moderate amount stool present throughout. Vascular/Lymphatic: There are no enlarged mesenteric, retroperitoneal, or pelvic lymph nodes. Scattered aortic atherosclerotic calcifications are seen without aneurysmal dilatation. Reproductive: The patient is status post hysterectomy. No adnexal masses or collections seen. Other: A small amount of abdominopelvic ascites is seen. No abdominal wall hernia is noted. Musculoskeletal: No acute or significant osseous findings. A healed fracture deformity seen within the inferior left pubic rami. IMPRESSION: Findings consistent with a partial small bowel obstruction within the left mid abdomen within the mid ileal loops with a focal area of narrowing as described. Small amount abdominopelvic ascites Hiatal hernia with gastroesophageal reflux Aortic Atherosclerosis (ICD10-I70.0). Electronically Signed   By: Jonna Clark M.D.   On: 12/02/2019 19:05   DG Chest Portable 1 View  Result Date: 11/14/2019 CLINICAL DATA:  Abdominal pain EXAM: PORTABLE CHEST 1 VIEW  COMPARISON:  November 02, 2010 FINDINGS: The heart size and mediastinal contours are within normal limits. Aortic knob calcifications are seen. Stable calcifications seen along the right upper  paratracheal stripe. Both lungs are clear. Again noted is advanced left shoulder osteoarthritis. There is diffuse osteopenia. IMPRESSION: No active disease. Electronically Signed   By: Jonna Clark M.D.   On: 12-02-2019 17:39    Procedures Procedures (including critical care time)  Medications Ordered in ED Medications  fentaNYL (SUBLIMAZE) injection 50 mcg (50 mcg Intravenous Not Given 2019-12-02 1947)  lidocaine (XYLOCAINE) 2 % viscous mouth solution 15 mL (has no administration in time range)  morphine 4 MG/ML injection 4 mg (4 mg Intravenous Given December 02, 2019 1731)  ondansetron (ZOFRAN) injection 4 mg (4 mg Intravenous Given 12/02/19 1730)  lactated ringers bolus 1,000 mL (1,000 mLs Intravenous New Bag/Given 02-Dec-2019 1917)  sodium chloride 0.9 % bolus 1,000 mL (0 mLs Intravenous Stopped 2019-12-02 1915)  iohexol (OMNIPAQUE) 300 MG/ML solution 100 mL (100 mLs Intravenous Contrast Given 2019-12-02 1856)    ED Course  I have reviewed the triage vital signs and the nursing notes.  Pertinent labs & imaging results that were available during my care of the patient were reviewed by me and considered in my medical decision making (see chart for details).  Clinical Course as of Nov 19 1955  Caleen Essex 02-Dec-2019  1802 Upon reassessment, patient states her pain has not changed.  She also continues to deny additional pain or complaints. Noted to have had a change in blood pressure down to 74/44. This may have been due to the morphine. No noted change in mental status or pulse rate.  IV fluid bolus ordered.   [SJ]  1848 Blood pressure improved to 105/64.   [SJ]  1930 Spoke with Dr. Magnus Ivan, general surgeon. We discussed the patient's presentation, physical exam, and CT findings. He reviewed the CT images himself.   Recommends admission via hospitalist and place NG tube.   [SJ]  9562 Spoke with Dr. Allena Katz, hospitalist.  Agrees to admit the patient.   [SJ]    Clinical Course User Index [SJ] Dakayla Disanti, Hillard Danker, PA-C   MDM Rules/Calculators/A&P                          Patient presents with onset of abdominal pain beginning this morning. She does appear to be in a great amount of pain upon arrival here in the ED. Patient is nontoxic appearing, afebrile, not tachycardic, not tachypneic, not hypotensive upon arrival, maintains excellent SPO2 on room air.  I have reviewed the patient's chart to obtain more information.   I reviewed and interpreted the patient's labs and radiological studies. Mild leukocytosis at 11.0, other lab results reassuring. CT with evidence of small bowel obstruction. Patient admitted for further management.  The patient and her sister at the bedside confirm patient is a DNR.  Findings and plan of care discussed with Kristine Royal, MD. Dr. Rodena Medin personally evaluated and examined this patient.  Vitals:   12-02-2019 1640 02-Dec-2019 1815 2019-12-02 1830 12-02-2019 1907  BP: (!) 128/58 100/80 94/82 (!) 125/105  Pulse: (!) 58 63 66 82  Resp: 20 (!) 23 (!) 23 18  Temp: 97.8 F (36.6 C)     TempSrc: Oral     SpO2: 100% 95% 96% 96%     Final Clinical Impression(s) / ED Diagnoses Final diagnoses:  SBO (small bowel obstruction) [facility])    Rx / DC Orders ED Discharge Orders    None       Anselm Pancoast, PA-C 2019-12-02 1950    Anselm Pancoast, PA-C 12-02-19 1957  Wynetta FinesMessick, Peter C, MD 11/30/2019 2326

## 2019-11-20 ENCOUNTER — Inpatient Hospital Stay (HOSPITAL_COMMUNITY): Payer: Medicare Other

## 2019-11-20 DIAGNOSIS — K56600 Partial intestinal obstruction, unspecified as to cause: Secondary | ICD-10-CM | POA: Diagnosis not present

## 2019-11-20 LAB — CBC
HCT: 32.9 % — ABNORMAL LOW (ref 36.0–46.0)
Hemoglobin: 10.4 g/dL — ABNORMAL LOW (ref 12.0–15.0)
MCH: 30.1 pg (ref 26.0–34.0)
MCHC: 31.6 g/dL (ref 30.0–36.0)
MCV: 95.1 fL (ref 80.0–100.0)
Platelets: 275 10*3/uL (ref 150–400)
RBC: 3.46 MIL/uL — ABNORMAL LOW (ref 3.87–5.11)
RDW: 14.9 % (ref 11.5–15.5)
WBC: 13.6 10*3/uL — ABNORMAL HIGH (ref 4.0–10.5)
nRBC: 0 % (ref 0.0–0.2)

## 2019-11-20 LAB — BASIC METABOLIC PANEL
Anion gap: 15 (ref 5–15)
BUN: 26 mg/dL — ABNORMAL HIGH (ref 8–23)
CO2: 18 mmol/L — ABNORMAL LOW (ref 22–32)
Calcium: 8.5 mg/dL — ABNORMAL LOW (ref 8.9–10.3)
Chloride: 106 mmol/L (ref 98–111)
Creatinine, Ser: 1.13 mg/dL — ABNORMAL HIGH (ref 0.44–1.00)
GFR, Estimated: 42 mL/min — ABNORMAL LOW (ref >60)
Glucose, Bld: 126 mg/dL — ABNORMAL HIGH (ref 70–99)
Potassium: 4.8 mmol/L (ref 3.5–5.1)
Sodium: 139 mmol/L (ref 135–145)

## 2019-11-20 LAB — ABO/RH: ABO/RH(D): O POS

## 2019-11-20 MED ORDER — LACTATED RINGERS IV SOLN: INTRAVENOUS | Status: AC

## 2019-11-20 MED ORDER — SODIUM CHLORIDE 0.9 % IV BOLUS
500.0000 mL | Freq: Once | INTRAVENOUS | Status: AC
  Administered 2019-11-20: 500 mL via INTRAVENOUS

## 2019-11-20 MED ORDER — DIATRIZOATE MEGLUMINE & SODIUM 66-10 % PO SOLN
90.0000 mL | Freq: Once | ORAL | Status: AC
  Administered 2019-11-20: 90 mL via NASOGASTRIC
  Filled 2019-11-20: qty 90

## 2019-11-20 MED ORDER — MORPHINE SULFATE (PF) 2 MG/ML IV SOLN: 1.0000 mg | INTRAVENOUS | Status: DC | PRN

## 2019-11-20 MED ORDER — SODIUM CHLORIDE 0.9 % IV SOLN
2.0000 g | INTRAVENOUS | Status: DC
  Administered 2019-11-20 – 2019-11-23 (×4): 2 g via INTRAVENOUS
  Filled 2019-11-20: qty 2
  Filled 2019-11-20 (×2): qty 20
  Filled 2019-11-20 (×2): qty 2

## 2019-11-20 MED ORDER — ACETAMINOPHEN 10 MG/ML IV SOLN
1000.0000 mg | Freq: Once | INTRAVENOUS | Status: AC
  Administered 2019-11-20: 1000 mg via INTRAVENOUS
  Filled 2019-11-20: qty 100

## 2019-11-20 MED ORDER — SODIUM CHLORIDE 0.9 % IV BOLUS
1000.0000 mL | Freq: Once | INTRAVENOUS | Status: AC
  Administered 2019-11-20: 14:00:00 1000 mL via INTRAVENOUS

## 2019-11-20 NOTE — Progress Notes (Signed)
   11/20/19 1317  Assess: MEWS Score  BP (!) 61/43  Pulse Rate 82  Resp 20  SpO2 90 %  Assess: MEWS Score  MEWS Temp 0  MEWS Systolic 3  MEWS Pulse 0  MEWS RR 0  MEWS LOC 0  MEWS Score 3  MEWS Score Color Yellow  Assess: if the MEWS score is Yellow or Red  Were vital signs taken at a resting state? Yes  Focused Assessment Change from prior assessment (see assessment flowsheet)  Early Detection of Sepsis Score *See Row Information* Low  MEWS guidelines implemented *See Row Information* Yes  Treat  MEWS Interventions Escalated (See documentation below)  Pain Scale 0-10  Pain Score 0  Take Vital Signs  Increase Vital Sign Frequency  Yellow: Q 2hr X 2 then Q 4hr X 2, if remains yellow, continue Q 4hrs  Escalate  MEWS: Escalate Yellow: discuss with charge nurse/RN and consider discussing with provider and RRT  Notify: Charge Nurse/RN  Name of Charge Nurse/RN Notified Alda Berthold  Date Charge Nurse/RN Notified 11/20/19  Time Charge Nurse/RN Notified 1400  Notify: Provider  Provider Name/Title Albertine Grates, MD  Date Provider Notified 11/20/19  Time Provider Notified 1326  Notification Type Page  Notification Reason Change in status  Response See new orders  Date of Provider Response 11/20/19  Time of Provider Response 1325  Document  Patient Outcome Stabilized after interventions  Progress note created (see row info) Yes  Pt Alert and oriented at time of low BP.  Pt given NS bolus.  BP taken in leg and had higher reading.  Per Dr. Roda Shutters, pt's BP needs to be taken in legs for a more accurate reading.

## 2019-11-20 NOTE — Progress Notes (Signed)
Subjective/Chief Complaint: She reports abdominal pain is less and she is passing flatus   Objective: Vital signs in last 24 hours: Temp:  [97.8 F (36.6 C)-99.6 F (37.6 C)] 99.6 F (37.6 C) (10/16 0518) Pulse Rate:  [58-87] 69 (10/16 0518) Resp:  [15-23] 15 (10/16 0518) BP: (94-134)/(47-105) 101/47 (10/16 0518) SpO2:  [92 %-100 %] 92 % (10/16 0518) Weight:  [69.8 kg] 69.8 kg (10/15 2120) Last BM Date: 12-07-19  Intake/Output from previous day: 10/15 0701 - 10/16 0700 In: 778.2 [I.V.:778.2] Out: 0  Intake/Output this shift: No intake/output data recorded.  Exam: Awake and alert Looks more comfortable Abdomen still fairly tender with guarding and distension especially on the left  Lab Results:  Recent Labs    12-07-19 1703 Dec 07, 2019 1703 Dec 07, 2019 1712 11/20/19 0648  WBC 11.0*  --   --  13.6*  HGB 11.4*   < > 12.2 10.4*  HCT 36.6   < > 36.0 32.9*  PLT 316  --   --  275   < > = values in this interval not displayed.   BMET Recent Labs    07-Dec-2019 1703 2019-12-07 1712  NA 140 138  K 4.7 5.1  CL 103 104  CO2 24  --   GLUCOSE 123* 119*  BUN 30* 36*  CREATININE 1.11* 1.10*  CALCIUM 9.4  --    PT/INR No results for input(s): LABPROT, INR in the last 72 hours. ABG No results for input(s): PHART, HCO3 in the last 72 hours.  Invalid input(s): PCO2, PO2  Studies/Results: CT ABDOMEN PELVIS W CONTRAST  Result Date: Dec 07, 2019 CLINICAL DATA:  Acute lower abdominal pain none EXAM: CT ABDOMEN AND PELVIS WITH CONTRAST TECHNIQUE: Multidetector CT imaging of the abdomen and pelvis was performed using the standard protocol following bolus administration of intravenous contrast. CONTRAST:  OMNIPAQUE IOHEXOL 300 MG/ML  SOLN COMPARISON:  None. FINDINGS: Lower chest: The visualized heart size within normal limits. No pericardial fluid/thickening. A small hiatal hernia is present with fluid reflux in the distal esophagus. The visualized portions of the lungs are  clear. Hepatobiliary: The liver is normal in density without focal abnormality.The main portal vein is patent. The patient is status post cholecystectomy. Pancreas: Unremarkable. No pancreatic ductal dilatation or surrounding inflammatory changes. Spleen: Normal in size without focal abnormality. Adrenals/Urinary Tract: Both adrenal glands appear normal. Multiple bilateral low-density lesions are seen throughout kidneys likely renal cysts the largest in lower pole the right kidney measures 5.2 cm. There is mild atrophy. There is a punctate calcification in the pole the right kidney. Probable bilateral renal vascular calcifications are. The bladder is. Stomach/Bowel: As above there is a small hiatal hernia present. The small is unremarkable. Within the mid ileal loops in the left lower quadrant there is mildly dilated air and fluid-filled measuring up to 3 cm significant surrounding mesenteric edema and amount loculated fluid. There is a focal area narrowing seen within the left mid abdomen best seen series 2, image 49. There remainder the distal loops decompressed. There is a moderate amount stool present throughout. Vascular/Lymphatic: There are no enlarged mesenteric, retroperitoneal, or pelvic lymph nodes. Scattered aortic atherosclerotic calcifications are seen without aneurysmal dilatation. Reproductive: The patient is status post hysterectomy. No adnexal masses or collections seen. Other: A small amount of abdominopelvic ascites is seen. No abdominal wall hernia is noted. Musculoskeletal: No acute or significant osseous findings. A healed fracture deformity seen within the inferior left pubic rami. IMPRESSION: Findings consistent with a partial small bowel  obstruction within the left mid abdomen within the mid ileal loops with a focal area of narrowing as described. Small amount abdominopelvic ascites Hiatal hernia with gastroesophageal reflux Aortic Atherosclerosis (ICD10-I70.0). Electronically Signed   By:  Jonna Clark M.D.   On: 11/05/2019 19:05   DG Chest Portable 1 View  Result Date: 11/18/2019 CLINICAL DATA:  Abdominal pain EXAM: PORTABLE CHEST 1 VIEW COMPARISON:  November 02, 2010 FINDINGS: The heart size and mediastinal contours are within normal limits. Aortic knob calcifications are seen. Stable calcifications seen along the right upper paratracheal stripe. Both lungs are clear. Again noted is advanced left shoulder osteoarthritis. There is diffuse osteopenia. IMPRESSION: No active disease. Electronically Signed   By: Jonna Clark M.D.   On: 11/18/2019 17:39   DG Abd Portable 1V  Result Date: 11/20/2019 CLINICAL DATA:  Small-bowel obstruction EXAM: PORTABLE ABDOMEN - 1 VIEW COMPARISON:  None. FINDINGS: Mildly dilated loops of small bowel in the central abdomen. Excreted contrast in the urinary bladder. IMPRESSION: Mildly dilated loops of small bowel in the central abdomen. Electronically Signed   By: Deatra Robinson M.D.   On: 11/20/2019 05:03    Anti-infectives: Anti-infectives (From admission, onward)   None      Assessment/Plan: Partial SBO  Will continue her NG Order small bowel protocol WBC up more. Will start antibiotics for potential abdominal source   LOS: 1 day    Abigail Miyamoto MD 11/20/2019

## 2019-11-20 NOTE — Progress Notes (Addendum)
PROGRESS NOTE    Ashlee Cobb  ZOX:[id number] DOB: 05/14/1923 DOA: 11/13/2019 PCP: Gweneth DimitriMcNeill, Wendy, MD    Chief Complaint  Patient presents with  . Abdominal Pain    Brief Narrative:   Elderly female from [facility], admitted with partial small bowel obstruction On NG suction, general surgery following  Subjective:   Reports feeling better, tolerating NG suction, reports passing gas, currently denies pain  Assessment & Plan:   Principal Problem:   Partial small bowel obstruction (HCC) Active Problems:   Hyperlipidemia   Hypertension   Partial small bowel obstruction On NG suction, small bowel protocol, continue IV hydration ,will follow general surgery recommendation   Hyperlipidemia hold simvastatin due to n.p.o.  Blood pressure checked right right arm showed hypotension, blood pressure checked in leg showed a normotensive Patient does not have any symptom of hypotension, suspect she has right subclavian artery stenosis ,she denies right arm pain , denies syncope , right radial pulse palpable Will avoid checking blood pressure on the right arm, she has IV running in the left arm Will check blood pressure in the leg.   DVT prophylaxis: SCDs Start: 11/25/2019 2038   Code Status: DNR Family Communication: She declined my offer to call her sister Disposition:   Status is: Inpatient  Dispo: The patient is from: Assisted living              Anticipated d/c is to: To be determined              Anticipated d/c date is: To be determined              Patient currently on NG suction, not medically stable to discharge  Consultants:   General surgery  Procedures:   NG insertion  Antimicrobials:   Rocephin     Objective: Vitals:   11/20/2019 1936 11/14/2019 2120 11/20/19 0105 11/20/19 0518  BP: (!) 134/103 95/78 (!) 111/47 (!) 101/47  Pulse: 87 85 67 69  Resp: (!) 22 17 16 15   Temp:  98.5 F (36.9 C) 98.6 F (37 C) 99.6 F (37.6 C)    TempSrc:  Oral Oral Oral  SpO2: 99% 98% 97% 92%  Weight:  69.8 kg    Height:  5\' 3"  (1.6 m)      Intake/Output Summary (Last 24 hours) at 11/20/2019 1202 Last data filed at 11/20/2019 [id number] Gross per 24 hour  Intake 778.23 ml  Output 0 ml  Net 778.23 ml   Filed Weights   11/09/2019 2120  Weight: 69.8 kg    Examination:  General exam: Frail elderly female on NG suction, hard of hearing, but AAOx3, following commands Respiratory system: Clear to auscultation. Respiratory effort normal. Cardiovascular system: S1 & S2 heard, RRR.  Gastrointestinal system: Abdomen slightly distended, generalized tenderness, hypoactive bowel sounds  Central nervous system: Alert and orientedx3. No focal neurological deficits. Extremities: Generalized weakness, moving all extremities Skin: No rashes, lesions or ulcers Psychiatry: Judgement and insight appear normal. Mood & affect appropriate.     Data Reviewed: I have personally reviewed following labs and imaging studies  CBC: Recent Labs  Lab 11/14/2019 1703 12/05/2019 1712 11/20/19 0648  WBC 11.0*  --  13.6*  NEUTROABS 9.0*  --   --   HGB 11.4* 12.2 10.4*  HCT 36.6 36.0 32.9*  MCV 95.8  --  95.1  PLT 316  --  275    Basic Metabolic Panel: Recent Labs  Lab 11/24/2019 1703 11/11/2019 1712  NA 140  138  K 4.7 5.1  CL 103 104  CO2 24  --   GLUCOSE 123* 119*  BUN 30* 36*  CREATININE 1.11* 1.10*  CALCIUM 9.4  --     GFR: Estimated Creatinine Clearance: 29.3 mL/min (A) (by C-G formula based on SCr of 1.1 mg/dL (H)).  Liver Function Tests: Recent Labs  Lab 11/17/2019 1703  AST 26  ALT 14  ALKPHOS 63  BILITOT 0.7  PROT 7.4  ALBUMIN 4.2    CBG: No results for input(s): GLUCAP in the last 168 hours.   Recent Results (from the past 240 hour(s))  Respiratory Panel by RT PCR (Flu A&B, Covid) - Nasopharyngeal Swab     Status: None   Collection Time: 12/02/2019  5:22 PM   Specimen: Nasopharyngeal Swab  Result Value Ref Range Status    SARS Coronavirus 2 by RT PCR NEGATIVE NEGATIVE Final    Comment: (NOTE) SARS-CoV-2 target nucleic acids are NOT DETECTED.  The SARS-CoV-2 RNA is generally detectable in upper respiratoy specimens during the acute phase of infection. The lowest concentration of SARS-CoV-2 viral copies this assay can detect is 131 copies/mL. A negative result does not preclude SARS-Cov-2 infection and should not be used as the sole basis for treatment or other patient management decisions. A negative result may occur with  improper specimen collection/handling, submission of specimen other than nasopharyngeal swab, presence of viral mutation(s) within the areas targeted by this assay, and inadequate number of viral copies (<131 copies/mL). A negative result must be combined with clinical observations, patient history, and epidemiological information. The expected result is Negative.  Fact Sheet for Patients:  https://www.moore.com/  Fact Sheet for Healthcare Providers:  https://www.young.biz/  This test is no t yet approved or cleared by the [place] FDA and  has been authorized for detection and/or diagnosis of SARS-CoV-2 by FDA under an Emergency Use Authorization (EUA). This EUA will remain  in effect (meaning this test can be used) for the duration of the COVID-19 declaration under Section 564(b)(1) of the Act, 21 U.S.C. section 360bbb-3(b)(1), unless the authorization is terminated or revoked sooner.     Influenza A by PCR NEGATIVE NEGATIVE Final   Influenza B by PCR NEGATIVE NEGATIVE Final    Comment: (NOTE) The Xpert Xpress SARS-CoV-2/FLU/RSV assay is intended as an aid in  the diagnosis of influenza from Nasopharyngeal swab specimens and  should not be used as a sole basis for treatment. Nasal washings and  aspirates are unacceptable for Xpert Xpress SARS-CoV-2/FLU/RSV  testing.  Fact Sheet for  Patients: https://www.moore.com/  Fact Sheet for Healthcare Providers: https://www.young.biz/  This test is not yet approved or cleared by the [place] FDA and  has been authorized for detection and/or diagnosis of SARS-CoV-2 by  FDA under an Emergency Use Authorization (EUA). This EUA will remain  in effect (meaning this test can be used) for the duration of the  Covid-19 declaration under Section 564(b)(1) of the Act, 21  U.S.C. section 360bbb-3(b)(1), unless the authorization is  terminated or revoked. Performed at [facility], [address] [place], [place] [address]          Radiology Studies: CT ABDOMEN PELVIS W CONTRAST  Result Date: 11/28/2019 CLINICAL DATA:  Acute lower abdominal pain none EXAM: CT ABDOMEN AND PELVIS WITH CONTRAST TECHNIQUE: Multidetector CT imaging of the abdomen and pelvis was performed using the standard protocol following bolus administration of intravenous contrast. CONTRAST:  OMNIPAQUE IOHEXOL 300 MG/ML  SOLN COMPARISON:  None. FINDINGS: Lower chest: The visualized heart size within normal limits. No pericardial fluid/thickening. A small hiatal hernia is present with fluid reflux in the distal esophagus. The visualized portions of the lungs are clear. Hepatobiliary: The liver is normal in density without focal abnormality.The main portal vein is patent. The patient is status post cholecystectomy. Pancreas: Unremarkable. No pancreatic ductal dilatation or surrounding inflammatory changes. Spleen: Normal in size without focal abnormality. Adrenals/Urinary Tract: Both adrenal glands appear normal. Multiple bilateral low-density lesions are seen throughout kidneys likely renal cysts the largest in lower pole the right kidney measures 5.2 cm. There is mild atrophy. There is a punctate calcification in the pole the right kidney. Probable bilateral renal vascular calcifications are. The bladder  is. Stomach/Bowel: As above there is a small hiatal hernia present. The small is unremarkable. Within the mid ileal loops in the left lower quadrant there is mildly dilated air and fluid-filled measuring up to 3 cm significant surrounding mesenteric edema and amount loculated fluid. There is a focal area narrowing seen within the left mid abdomen best seen series 2, image 49. There remainder the distal loops decompressed. There is a moderate amount stool present throughout. Vascular/Lymphatic: There are no enlarged mesenteric, retroperitoneal, or pelvic lymph nodes. Scattered aortic atherosclerotic calcifications are seen without aneurysmal dilatation. Reproductive: The patient is status post hysterectomy. No adnexal masses or collections seen. Other: A small amount of abdominopelvic ascites is seen. No abdominal wall hernia is noted. Musculoskeletal: No acute or significant osseous findings. A healed fracture deformity seen within the inferior left pubic rami. IMPRESSION: Findings consistent with a partial small bowel obstruction within the left mid abdomen within the mid ileal loops with a focal area of narrowing as described. Small amount abdominopelvic ascites Hiatal hernia with gastroesophageal reflux Aortic Atherosclerosis (ICD10-I70.0). Electronically Signed   By: Jonna Clark M.D.   On: 18-Dec-2019 19:05   DG Chest Portable 1 View  Result Date: 12/18/19 CLINICAL DATA:  Abdominal pain EXAM: PORTABLE CHEST 1 VIEW COMPARISON:  November 02, 2010 FINDINGS: The heart size and mediastinal contours are within normal limits. Aortic knob calcifications are seen. Stable calcifications seen along the right upper paratracheal stripe. Both lungs are clear. Again noted is advanced left shoulder osteoarthritis. There is diffuse osteopenia. IMPRESSION: No active disease. Electronically Signed   By: Jonna Clark M.D.   On: Dec 18, 2019 17:39   DG Abd Portable 1V  Result Date: 11/20/2019 CLINICAL DATA:  Repositioned  nasal/orogastric tube. EXAM: PORTABLE ABDOMEN - 1 VIEW COMPARISON:  11/20/2019 at 9:18 a.m. FINDINGS: Nasogastric tube has been further inserted, now extending well below the diaphragm to curl within the stomach. There are loops of mildly dilated small bowel in the central to left mid to lower abdomen. IMPRESSION: 1. Well-positioned nasal/orogastric tube. Electronically Signed   By: Amie Portland M.D.   On: 11/20/2019 11:18   DG Abd Portable 1V-Small Bowel Protocol-Position Verification  Result Date: 11/20/2019 CLINICAL DATA:  NG tube placement. EXAM: PORTABLE ABDOMEN - 1 VIEW COMPARISON:  11/20/2019 at 4:35 a.m. FINDINGS: Nasogastric tube has its tip just below the level of the carina, consistent with a mid esophageal position. IMPRESSION: 1. Nasal/orogastric tube tip projects in the midthoracic esophagus. This will need to be further inserted, 18-20 cm, to allow the tip to fully into the stomach. Electronically Signed   By: Amie Portland M.D.   On: 11/20/2019 11:17   DG Abd Portable 1V  Result Date: 11/20/2019 CLINICAL DATA:  Small-bowel obstruction EXAM: PORTABLE ABDOMEN -  1 VIEW COMPARISON:  None. FINDINGS: Mildly dilated loops of small bowel in the central abdomen. Excreted contrast in the urinary bladder. IMPRESSION: Mildly dilated loops of small bowel in the central abdomen. Electronically Signed   By: Deatra Robinson M.D.   On: 11/20/2019 05:03        Scheduled Meds: . diatrizoate meglumine-sodium  90 mL Per NG tube Once  . fentaNYL (SUBLIMAZE) injection  50 mcg Intravenous Once   Continuous Infusions: . cefTRIAXone (ROCEPHIN)  IV    . lactated ringers 100 mL/hr at 11/20/19 0848     LOS: 1 day   Time spent: Greater than 50% of this time was spent in counseling, explanation of diagnosis, planning of further management, and coordination of care.  I have personally reviewed and interpreted on  11/20/2019 daily labs, tele strips, imagings as discussed above under date review  session and assessment and plans.  I reviewed all nursing notes, pharmacy notes, consultant notes,  vitals, pertinent old records  I have discussed plan of care as described above with RN , patient  on 11/20/2019  Voice Recognition /Dragon dictation system was used to create this note, attempts have been made to correct errors. Please contact the author with questions and/or clarifications.   Albertine Grates, MD PhD FACP [facility]  Available via Epic secure chat 7am-7pm for nonurgent issues Please page for urgent issues To page the attending provider between 7A-7P or the covering provider during after hours 7P-7A, please log into the web site [email or web address] and access using universal [place] password for that web site. If you do not have the password, please call the hospital operator.    11/20/2019, 12:02 PM

## 2019-11-21 ENCOUNTER — Inpatient Hospital Stay (HOSPITAL_COMMUNITY): Payer: Medicare Other

## 2019-11-21 DIAGNOSIS — K56600 Partial intestinal obstruction, unspecified as to cause: Secondary | ICD-10-CM | POA: Diagnosis not present

## 2019-11-21 LAB — MAGNESIUM: Magnesium: 2.1 mg/dL (ref 1.7–2.4)

## 2019-11-21 LAB — BASIC METABOLIC PANEL
Anion gap: 9 (ref 5–15)
BUN: 41 mg/dL — ABNORMAL HIGH (ref 8–23)
CO2: 23 mmol/L (ref 22–32)
Calcium: 8.2 mg/dL — ABNORMAL LOW (ref 8.9–10.3)
Chloride: 109 mmol/L (ref 98–111)
Creatinine, Ser: 1.29 mg/dL — ABNORMAL HIGH (ref 0.44–1.00)
GFR, Estimated: 35 mL/min — ABNORMAL LOW (ref >60)
Glucose, Bld: 121 mg/dL — ABNORMAL HIGH (ref 70–99)
Potassium: 4.1 mmol/L (ref 3.5–5.1)
Sodium: 141 mmol/L (ref 135–145)

## 2019-11-21 LAB — LACTIC ACID, PLASMA: Lactic Acid, Venous: 1.3 mmol/L (ref 0.5–1.9)

## 2019-11-21 LAB — CBC WITH DIFFERENTIAL/PLATELET
Abs Immature Granulocytes: 0.08 10*3/uL — ABNORMAL HIGH (ref 0.00–0.07)
Basophils Absolute: 0.1 10*3/uL (ref 0.0–0.1)
Basophils Relative: 0 %
Eosinophils Absolute: 0 10*3/uL (ref 0.0–0.5)
Eosinophils Relative: 0 %
HCT: 30 % — ABNORMAL LOW (ref 36.0–46.0)
Hemoglobin: 9.5 g/dL — ABNORMAL LOW (ref 12.0–15.0)
Immature Granulocytes: 1 %
Lymphocytes Relative: 11 %
Lymphs Abs: 1.5 10*3/uL (ref 0.7–4.0)
MCH: 30.1 pg (ref 26.0–34.0)
MCHC: 31.7 g/dL (ref 30.0–36.0)
MCV: 94.9 fL (ref 80.0–100.0)
Monocytes Absolute: 0.8 10*3/uL (ref 0.1–1.0)
Monocytes Relative: 6 %
Neutro Abs: 10.9 10*3/uL — ABNORMAL HIGH (ref 1.7–7.7)
Neutrophils Relative %: 82 %
Platelets: 235 10*3/uL (ref 150–400)
RBC: 3.16 MIL/uL — ABNORMAL LOW (ref 3.87–5.11)
RDW: 15.3 % (ref 11.5–15.5)
WBC: 13.4 10*3/uL — ABNORMAL HIGH (ref 4.0–10.5)
nRBC: 0 % (ref 0.0–0.2)

## 2019-11-21 MED ORDER — METRONIDAZOLE IN NACL 5-0.79 MG/ML-% IV SOLN
500.0000 mg | Freq: Three times a day (TID) | INTRAVENOUS | Status: DC
  Administered 2019-11-21 – 2019-11-23 (×8): 500 mg via INTRAVENOUS
  Filled 2019-11-21 (×8): qty 100

## 2019-11-21 MED ORDER — LACTATED RINGERS IV SOLN: INTRAVENOUS | Status: AC

## 2019-11-21 NOTE — Progress Notes (Signed)
PROGRESS NOTE    Ashlee Cobb  VEL:[id number] DOB: Feb 05, 1923 DOA: 11/06/2019 PCP: Gweneth Dimitri, MD    Chief Complaint  Patient presents with  . Abdominal Pain    Brief Narrative:   Elderly female from [facility], admitted with partial small bowel obstruction On NG suction, general surgery following  Subjective:  Fever 100.9 yesterday at 9pm, wbc increasing , tolerating NG suction, patient is drowsy but able to communicate and oriented x3, currently denies pain  Assessment & Plan:   Principal Problem:   Partial small bowel obstruction (HCC) Active Problems:   Hyperlipidemia   Hypertension   Partial small bowel obstruction On NG suction, small bowel protocol, continue IV hydration ,will follow general surgery recommendation  Fever, ua on presentation unremarkable, will add on blood culture,  Get portable cxr, continue rocephin, add on flagyl to cover intraabdominal infection  AKI on CKDIIIa Bun/cr worsening, will continue ivf, renal dosing meds   Hyperlipidemia hold simvastatin due to n.p.o.  Blood pressure checked right right arm showed hypotension ( 60/40), blood pressure checked in leg showed a normotensive Patient does not have  symptoms of hypotension, suspect she has right subclavian artery stenosis ,she denies right arm pain , denies syncope , right radial pulse palpable Will avoid checking blood pressure on right arm, she has IV running in the left arm Will check blood pressure in the leg.   DVT prophylaxis: SCDs Start: 11/05/2019 2038   Code Status: DNR Family Communication: She declined my offer to call her sister again Disposition:   Status is: Inpatient  Dispo: The patient is from: Assisted living              Anticipated d/c is to: To be determined              Anticipated d/c date is: To be determined              Patient currently on NG suction, not medically stable to discharge  Consultants:   General  surgery  Procedures:   NG insertion  Antimicrobials:   Rocephin/flagyl     Objective: Vitals:   11/20/19 1528 11/20/19 1734 11/20/19 2110 11/21/19 0601  BP: (!) 120/33 (!) 121/46 (!) 108/40 (!) 105/39  Pulse: 88 89 69 77  Resp:  18 16 15   Temp: 98.4 F (36.9 C)  (!) 100.9 F (38.3 C) 98.6 F (37 C)  TempSrc: Oral  Oral Oral  SpO2: 92% 91% 90% 91%  Weight:      Height:        Intake/Output Summary (Last 24 hours) at 11/21/2019 11/11/2019 Last data filed at 11/21/2019 0700 Gross per 24 hour  Intake 2657.03 ml  Output 1850 ml  Net 807.03 ml   Filed Weights   11/06/2019 2120  Weight: 69.8 kg    Examination:  General exam: Frail elderly female on NG suction, hard of hearing, drowsy, following commands Respiratory system:  Diminished at basis, no wheezing, no rhonchi, no rales, poor Respiratory effort. Cardiovascular system: S1 & S2 heard, RRR.  Gastrointestinal system: Abdomen slightly distended, generalized tenderness, hypoactive bowel sounds  Central nervous system:  orientedx3. No focal neurological deficits. Extremities: Generalized weakness, moving all extremities Skin: No rashes, lesions or ulcers Psychiatry: drowsy    Data Reviewed: I have personally reviewed following labs and imaging studies  CBC: Recent Labs  Lab 11/08/2019 1703 11/12/2019 1712 11/20/19 0648 11/21/19 0557  WBC 11.0*  --  13.6* 13.4*  NEUTROABS 9.0*  --   --  10.9*  HGB 11.4* 12.2 10.4* 9.5*  HCT 36.6 36.0 32.9* 30.0*  MCV 95.8  --  95.1 94.9  PLT 316  --  275 235    Basic Metabolic Panel: Recent Labs  Lab 2019-11-21 1703 21-Nov-2019 1712 11/20/19 0648 11/21/19 0557  NA 140 138 139 141  K 4.7 5.1 4.8 4.1  CL 103 104 106 109  CO2 24  --  18* 23  GLUCOSE 123* 119* 126* 121*  BUN 30* 36* 26* 41*  CREATININE 1.11* 1.10* 1.13* 1.29*  CALCIUM 9.4  --  8.5* 8.2*  MG  --   --   --  2.1    GFR: Estimated Creatinine Clearance: 25 mL/min (A) (by C-G formula based on SCr of 1.29 mg/dL  (H)).  Liver Function Tests: Recent Labs  Lab 11/21/2019 1703  AST 26  ALT 14  ALKPHOS 63  BILITOT 0.7  PROT 7.4  ALBUMIN 4.2    CBG: No results for input(s): GLUCAP in the last 168 hours.   Recent Results (from the past 240 hour(s))  Respiratory Panel by RT PCR (Flu A&B, Covid) - Nasopharyngeal Swab     Status: None   Collection Time: 11/21/19  5:22 PM   Specimen: Nasopharyngeal Swab  Result Value Ref Range Status   SARS Coronavirus 2 by RT PCR NEGATIVE NEGATIVE Final    Comment: (NOTE) SARS-CoV-2 target nucleic acids are NOT DETECTED.  The SARS-CoV-2 RNA is generally detectable in upper respiratoy specimens during the acute phase of infection. The lowest concentration of SARS-CoV-2 viral copies this assay can detect is 131 copies/mL. A negative result does not preclude SARS-Cov-2 infection and should not be used as the sole basis for treatment or other patient management decisions. A negative result may occur with  improper specimen collection/handling, submission of specimen other than nasopharyngeal swab, presence of viral mutation(s) within the areas targeted by this assay, and inadequate number of viral copies (<131 copies/mL). A negative result must be combined with clinical observations, patient history, and epidemiological information. The expected result is Negative.  Fact Sheet for Patients:  https://www.moore.com/  Fact Sheet for Healthcare Providers:  https://www.young.biz/  This test is no t yet approved or cleared by the [place] FDA and  has been authorized for detection and/or diagnosis of SARS-CoV-2 by FDA under an Emergency Use Authorization (EUA). This EUA will remain  in effect (meaning this test can be used) for the duration of the COVID-19 declaration under Section 564(b)(1) of the Act, 21 U.S.C. section 360bbb-3(b)(1), unless the authorization is terminated or revoked sooner.     Influenza A by PCR  NEGATIVE NEGATIVE Final   Influenza B by PCR NEGATIVE NEGATIVE Final    Comment: (NOTE) The Xpert Xpress SARS-CoV-2/FLU/RSV assay is intended as an aid in  the diagnosis of influenza from Nasopharyngeal swab specimens and  should not be used as a sole basis for treatment. Nasal washings and  aspirates are unacceptable for Xpert Xpress SARS-CoV-2/FLU/RSV  testing.  Fact Sheet for Patients: https://www.moore.com/  Fact Sheet for Healthcare Providers: https://www.young.biz/  This test is not yet approved or cleared by the [place] FDA and  has been authorized for detection and/or diagnosis of SARS-CoV-2 by  FDA under an Emergency Use Authorization (EUA). This EUA will remain  in effect (meaning this test can be used) for the duration of the  Covid-19 declaration under Section 564(b)(1) of the Act, 21  U.S.C. section 360bbb-3(b)(1), unless the authorization is  terminated or revoked. Performed at  [facility], [address], [place], [place] [address]          Radiology Studies: CT ABDOMEN PELVIS W CONTRAST  Result Date: 12/09/19 CLINICAL DATA:  Acute lower abdominal pain none EXAM: CT ABDOMEN AND PELVIS WITH CONTRAST TECHNIQUE: Multidetector CT imaging of the abdomen and pelvis was performed using the standard protocol following bolus administration of intravenous contrast. CONTRAST:  OMNIPAQUE IOHEXOL 300 MG/ML  SOLN COMPARISON:  None. FINDINGS: Lower chest: The visualized heart size within normal limits. No pericardial fluid/thickening. A small hiatal hernia is present with fluid reflux in the distal esophagus. The visualized portions of the lungs are clear. Hepatobiliary: The liver is normal in density without focal abnormality.The main portal vein is patent. The patient is status post cholecystectomy. Pancreas: Unremarkable. No pancreatic ductal dilatation or surrounding inflammatory changes. Spleen: Normal in  size without focal abnormality. Adrenals/Urinary Tract: Both adrenal glands appear normal. Multiple bilateral low-density lesions are seen throughout kidneys likely renal cysts the largest in lower pole the right kidney measures 5.2 cm. There is mild atrophy. There is a punctate calcification in the pole the right kidney. Probable bilateral renal vascular calcifications are. The bladder is. Stomach/Bowel: As above there is a small hiatal hernia present. The small is unremarkable. Within the mid ileal loops in the left lower quadrant there is mildly dilated air and fluid-filled measuring up to 3 cm significant surrounding mesenteric edema and amount loculated fluid. There is a focal area narrowing seen within the left mid abdomen best seen series 2, image 49. There remainder the distal loops decompressed. There is a moderate amount stool present throughout. Vascular/Lymphatic: There are no enlarged mesenteric, retroperitoneal, or pelvic lymph nodes. Scattered aortic atherosclerotic calcifications are seen without aneurysmal dilatation. Reproductive: The patient is status post hysterectomy. No adnexal masses or collections seen. Other: A small amount of abdominopelvic ascites is seen. No abdominal wall hernia is noted. Musculoskeletal: No acute or significant osseous findings. A healed fracture deformity seen within the inferior left pubic rami. IMPRESSION: Findings consistent with a partial small bowel obstruction within the left mid abdomen within the mid ileal loops with a focal area of narrowing as described. Small amount abdominopelvic ascites Hiatal hernia with gastroesophageal reflux Aortic Atherosclerosis (ICD10-I70.0). Electronically Signed   By: Jonna Clark M.D.   On: Dec 09, 2019 19:05   DG Chest Portable 1 View  Result Date: 12/09/19 CLINICAL DATA:  Abdominal pain EXAM: PORTABLE CHEST 1 VIEW COMPARISON:  November 02, 2010 FINDINGS: The heart size and mediastinal contours are within normal limits.  Aortic knob calcifications are seen. Stable calcifications seen along the right upper paratracheal stripe. Both lungs are clear. Again noted is advanced left shoulder osteoarthritis. There is diffuse osteopenia. IMPRESSION: No active disease. Electronically Signed   By: Jonna Clark M.D.   On: Dec 09, 2019 17:39   DG Abd Portable 1V-Small Bowel Obstruction Protocol-initial, 8 hr delay  Result Date: 11/20/2019 CLINICAL DATA:  Small bowel obstruction. EXAM: PORTABLE ABDOMEN - 1 VIEW COMPARISON:  11/20/2019 at 10:39 a.m. FINDINGS: An enteric tube terminates in the gastric fundus. The stomach is moderately distended by oral contrast material, and there is a small to moderate-sized sliding hiatal hernia. Oral contrast is present in 2 loops of dilated proximal small bowel, and there is persistent mild dilatation of multiple gas-filled small bowel loops more distally in the central and left lower abdomen. Excreted contrast material is noted in the bladder. IMPRESSION: Persistent small bowel dilatation consistent with obstruction. Oral contrast in the  stomach and proximal small bowel. Electronically Signed   By: Sebastian AcheAllen  Grady M.D.   On: 11/20/2019 21:14   DG Abd Portable 1V  Result Date: 11/20/2019 CLINICAL DATA:  Repositioned nasal/orogastric tube. EXAM: PORTABLE ABDOMEN - 1 VIEW COMPARISON:  11/20/2019 at 9:18 a.m. FINDINGS: Nasogastric tube has been further inserted, now extending well below the diaphragm to curl within the stomach. There are loops of mildly dilated small bowel in the central to left mid to lower abdomen. IMPRESSION: 1. Well-positioned nasal/orogastric tube. Electronically Signed   By: Amie Portlandavid  Ormond M.D.   On: 11/20/2019 11:18   DG Abd Portable 1V-Small Bowel Protocol-Position Verification  Result Date: 11/20/2019 CLINICAL DATA:  NG tube placement. EXAM: PORTABLE ABDOMEN - 1 VIEW COMPARISON:  11/20/2019 at 4:35 a.m. FINDINGS: Nasogastric tube has its tip just below the level of the carina,  consistent with a mid esophageal position. IMPRESSION: 1. Nasal/orogastric tube tip projects in the midthoracic esophagus. This will need to be further inserted, 18-20 cm, to allow the tip to fully into the stomach. Electronically Signed   By: Amie Portlandavid  Ormond M.D.   On: 11/20/2019 11:17   DG Abd Portable 1V  Result Date: 11/20/2019 CLINICAL DATA:  Small-bowel obstruction EXAM: PORTABLE ABDOMEN - 1 VIEW COMPARISON:  None. FINDINGS: Mildly dilated loops of small bowel in the central abdomen. Excreted contrast in the urinary bladder. IMPRESSION: Mildly dilated loops of small bowel in the central abdomen. Electronically Signed   By: Deatra RobinsonKevin  Herman M.D.   On: 11/20/2019 05:03        Scheduled Meds: . fentaNYL (SUBLIMAZE) injection  50 mcg Intravenous Once   Continuous Infusions: . cefTRIAXone (ROCEPHIN)  IV 2 g (11/20/19 1203)  . lactated ringers 75 mL/hr at 11/20/19 1719  . lactated ringers    . metronidazole       LOS: 2 days   Time spent: 25mins Greater than 50% of this time was spent in counseling, explanation of diagnosis, planning of further management, and coordination of care.  I have personally reviewed and interpreted on  11/21/2019 daily labs, tele strips, imagings as discussed above under date review session and assessment and plans.  I reviewed all nursing notes, pharmacy notes, consultant notes,  vitals, pertinent old records  I have discussed plan of care as described above with RN , patient  on 11/21/2019  Voice Recognition /Dragon dictation system was used to create this note, attempts have been made to correct errors. Please contact the author with questions and/or clarifications.   Albertine GratesFang Jeff Frieden, MD PhD FACP [facility]  Available via Epic secure chat 7am-7pm for nonurgent issues Please page for urgent issues To page the attending provider between 7A-7P or the covering provider during after hours 7P-7A, please log into the web site [email or web address] and access using  universal [place] password for that web site. If you do not have the password, please call the hospital operator.    11/21/2019, 8:21 AM

## 2019-11-21 NOTE — Progress Notes (Signed)
Subjective/Chief Complaint: She reports much less abdominal pain Not passing flatus   Objective: Vital signs in last 24 hours: Temp:  [98.4 F (36.9 C)-100.9 F (38.3 C)] 98.6 F (37 C) (10/17 0601) Pulse Rate:  [69-89] 77 (10/17 0601) Resp:  [15-20] 15 (10/17 0601) BP: (61-121)/(33-46) 105/39 (10/17 0601) SpO2:  [90 %-92 %] 91 % (10/17 0601) Last BM Date: 12/14/2019  Intake/Output from previous day: 10/16 0701 - 10/17 0700 In: 2657 [I.V.:951.3; IV Piggyback:1705.8] Out: 1850 [Urine:150; Emesis/NG output:1700] Intake/Output this shift: No intake/output data recorded.  Exam: Awake, follows commands Abdomen soft but still tender with guarding on the left  Lab Results:  Recent Labs    11/20/19 0648 11/21/19 0557  WBC 13.6* 13.4*  HGB 10.4* 9.5*  HCT 32.9* 30.0*  PLT 275 235   BMET Recent Labs    11/20/19 0648 11/21/19 0557  NA 139 141  K 4.8 4.1  CL 106 109  CO2 18* 23  GLUCOSE 126* 121*  BUN 26* 41*  CREATININE 1.13* 1.29*  CALCIUM 8.5* 8.2*   PT/INR No results for input(s): LABPROT, INR in the last 72 hours. ABG No results for input(s): PHART, HCO3 in the last 72 hours.  Invalid input(s): PCO2, PO2  Studies/Results: CT ABDOMEN PELVIS W CONTRAST  Result Date: Dec 14, 2019 CLINICAL DATA:  Acute lower abdominal pain none EXAM: CT ABDOMEN AND PELVIS WITH CONTRAST TECHNIQUE: Multidetector CT imaging of the abdomen and pelvis was performed using the standard protocol following bolus administration of intravenous contrast. CONTRAST:  OMNIPAQUE IOHEXOL 300 MG/ML  SOLN COMPARISON:  None. FINDINGS: Lower chest: The visualized heart size within normal limits. No pericardial fluid/thickening. A small hiatal hernia is present with fluid reflux in the distal esophagus. The visualized portions of the lungs are clear. Hepatobiliary: The liver is normal in density without focal abnormality.The main portal vein is patent. The patient is status post cholecystectomy.  Pancreas: Unremarkable. No pancreatic ductal dilatation or surrounding inflammatory changes. Spleen: Normal in size without focal abnormality. Adrenals/Urinary Tract: Both adrenal glands appear normal. Multiple bilateral low-density lesions are seen throughout kidneys likely renal cysts the largest in lower pole the right kidney measures 5.2 cm. There is mild atrophy. There is a punctate calcification in the pole the right kidney. Probable bilateral renal vascular calcifications are. The bladder is. Stomach/Bowel: As above there is a small hiatal hernia present. The small is unremarkable. Within the mid ileal loops in the left lower quadrant there is mildly dilated air and fluid-filled measuring up to 3 cm significant surrounding mesenteric edema and amount loculated fluid. There is a focal area narrowing seen within the left mid abdomen best seen series 2, image 49. There remainder the distal loops decompressed. There is a moderate amount stool present throughout. Vascular/Lymphatic: There are no enlarged mesenteric, retroperitoneal, or pelvic lymph nodes. Scattered aortic atherosclerotic calcifications are seen without aneurysmal dilatation. Reproductive: The patient is status post hysterectomy. No adnexal masses or collections seen. Other: A small amount of abdominopelvic ascites is seen. No abdominal wall hernia is noted. Musculoskeletal: No acute or significant osseous findings. A healed fracture deformity seen within the inferior left pubic rami. IMPRESSION: Findings consistent with a partial small bowel obstruction within the left mid abdomen within the mid ileal loops with a focal area of narrowing as described. Small amount abdominopelvic ascites Hiatal hernia with gastroesophageal reflux Aortic Atherosclerosis (ICD10-I70.0). Electronically Signed   By: Jonna Clark M.D.   On: 12-14-2019 19:05   DG Chest Portable 1 View  Result Date: 12-06-2019 CLINICAL DATA:  Abdominal pain EXAM: PORTABLE CHEST 1 VIEW  COMPARISON:  November 02, 2010 FINDINGS: The heart size and mediastinal contours are within normal limits. Aortic knob calcifications are seen. Stable calcifications seen along the right upper paratracheal stripe. Both lungs are clear. Again noted is advanced left shoulder osteoarthritis. There is diffuse osteopenia. IMPRESSION: No active disease. Electronically Signed   By: Jonna Clark M.D.   On: 12-06-19 17:39   DG Abd Portable 1V-Small Bowel Obstruction Protocol-initial, 8 hr delay  Result Date: 11/20/2019 CLINICAL DATA:  Small bowel obstruction. EXAM: PORTABLE ABDOMEN - 1 VIEW COMPARISON:  11/20/2019 at 10:39 a.m. FINDINGS: An enteric tube terminates in the gastric fundus. The stomach is moderately distended by oral contrast material, and there is a small to moderate-sized sliding hiatal hernia. Oral contrast is present in 2 loops of dilated proximal small bowel, and there is persistent mild dilatation of multiple gas-filled small bowel loops more distally in the central and left lower abdomen. Excreted contrast material is noted in the bladder. IMPRESSION: Persistent small bowel dilatation consistent with obstruction. Oral contrast in the stomach and proximal small bowel. Electronically Signed   By: Sebastian Ache M.D.   On: 11/20/2019 21:14   DG Abd Portable 1V  Result Date: 11/20/2019 CLINICAL DATA:  Repositioned nasal/orogastric tube. EXAM: PORTABLE ABDOMEN - 1 VIEW COMPARISON:  11/20/2019 at 9:18 a.m. FINDINGS: Nasogastric tube has been further inserted, now extending well below the diaphragm to curl within the stomach. There are loops of mildly dilated small bowel in the central to left mid to lower abdomen. IMPRESSION: 1. Well-positioned nasal/orogastric tube. Electronically Signed   By: Amie Portland M.D.   On: 11/20/2019 11:18   DG Abd Portable 1V-Small Bowel Protocol-Position Verification  Result Date: 11/20/2019 CLINICAL DATA:  NG tube placement. EXAM: PORTABLE ABDOMEN - 1 VIEW  COMPARISON:  11/20/2019 at 4:35 a.m. FINDINGS: Nasogastric tube has its tip just below the level of the carina, consistent with a mid esophageal position. IMPRESSION: 1. Nasal/orogastric tube tip projects in the midthoracic esophagus. This will need to be further inserted, 18-20 cm, to allow the tip to fully into the stomach. Electronically Signed   By: Amie Portland M.D.   On: 11/20/2019 11:17   DG Abd Portable 1V  Result Date: 11/20/2019 CLINICAL DATA:  Small-bowel obstruction EXAM: PORTABLE ABDOMEN - 1 VIEW COMPARISON:  None. FINDINGS: Mildly dilated loops of small bowel in the central abdomen. Excreted contrast in the urinary bladder. IMPRESSION: Mildly dilated loops of small bowel in the central abdomen. Electronically Signed   By: Deatra Robinson M.D.   On: 11/20/2019 05:03    Anti-infectives: Anti-infectives (From admission, onward)   Start     Dose/Rate Route Frequency Ordered Stop   11/20/19 0900  cefTRIAXone (ROCEPHIN) 2 g in sodium chloride 0.9 % 100 mL IVPB        2 g 200 mL/hr over 30 Minutes Intravenous Every 24 hours 11/20/19 0831        Assessment/Plan: Small bowel obstruction  Films show contrast still in the small bowel Will continue NPO and NG Repeat films in the morning Will likely need surgery but go slow given her age  LOS: 2 days    Abigail Miyamoto MD 11/21/2019

## 2019-11-22 ENCOUNTER — Inpatient Hospital Stay (HOSPITAL_COMMUNITY): Payer: Medicare Other

## 2019-11-22 DIAGNOSIS — K56600 Partial intestinal obstruction, unspecified as to cause: Secondary | ICD-10-CM | POA: Diagnosis not present

## 2019-11-22 LAB — CBC
HCT: 29 % — ABNORMAL LOW (ref 36.0–46.0)
Hemoglobin: 9.5 g/dL — ABNORMAL LOW (ref 12.0–15.0)
MCH: 30.4 pg (ref 26.0–34.0)
MCHC: 32.8 g/dL (ref 30.0–36.0)
MCV: 92.7 fL (ref 80.0–100.0)
Platelets: 239 10*3/uL (ref 150–400)
RBC: 3.13 MIL/uL — ABNORMAL LOW (ref 3.87–5.11)
RDW: 15.5 % (ref 11.5–15.5)
WBC: 13 10*3/uL — ABNORMAL HIGH (ref 4.0–10.5)
nRBC: 0.2 % (ref 0.0–0.2)

## 2019-11-22 LAB — BASIC METABOLIC PANEL
Anion gap: 12 (ref 5–15)
BUN: 43 mg/dL — ABNORMAL HIGH (ref 8–23)
CO2: 21 mmol/L — ABNORMAL LOW (ref 22–32)
Calcium: 8.2 mg/dL — ABNORMAL LOW (ref 8.9–10.3)
Chloride: 109 mmol/L (ref 98–111)
Creatinine, Ser: 1.18 mg/dL — ABNORMAL HIGH (ref 0.44–1.00)
GFR, Estimated: 39 mL/min — ABNORMAL LOW (ref >60)
Glucose, Bld: 108 mg/dL — ABNORMAL HIGH (ref 70–99)
Potassium: 3.6 mmol/L (ref 3.5–5.1)
Sodium: 142 mmol/L (ref 135–145)

## 2019-11-22 NOTE — Progress Notes (Signed)
[facility] Progress Note     Subjective: CC-  Feels about the same. Continues to have some abdominal bloating and discomfort. Unsure if she is passing flatus. BM x2 over night. NG tube with only 250cc output last 24 hours. Xray this AM shows improving small bowel distension.  Objective: Vital signs in last 24 hours: Temp:  [98.4 F (36.9 C)-99.1 F (37.3 C)] 99.1 F (37.3 C) (10/18 0504) Pulse Rate:  [96-101] 96 (10/18 0504) Resp:  [17] 17 (10/18 0504) BP: (103-132)/(55-81) 132/55 (10/18 0504) SpO2:  [88 %-92 %] 88 % (10/18 0504) Last BM Date: 11/21/19  Intake/Output from previous day: 10/17 0701 - 10/18 0700 In: 1929.4 [I.V.:1511.7; IV Piggyback:417.7] Out: 600 [Urine:350; Emesis/NG output:250] Intake/Output this shift: Total I/O In: 30 [NG/GT:30] Out: -   PE: Gen:  Alert, NAD, pleasant Pulm:  rate and effort normal Abd: Soft, distended, +BS, mild lower L>R TTP without rebound or guarding  Lab Results:  Recent Labs    11/21/19 0557 11/22/19 0515  WBC 13.4* 13.0*  HGB 9.5* 9.5*  HCT 30.0* 29.0*  PLT 235 239   BMET Recent Labs    11/21/19 0557 11/22/19 0515  NA 141 142  K 4.1 3.6  CL 109 109  CO2 23 21*  GLUCOSE 121* 108*  BUN 41* 43*  CREATININE 1.29* 1.18*  CALCIUM 8.2* 8.2*   PT/INR No results for input(s): LABPROT, INR in the last 72 hours. CMP     Component Value Date/Time   NA 142 11/22/2019 0515   K 3.6 11/22/2019 0515   CL 109 11/22/2019 0515   CO2 21 (L) 11/22/2019 0515   GLUCOSE 108 (H) 11/22/2019 0515   BUN 43 (H) 11/22/2019 0515   CREATININE 1.18 (H) 11/22/2019 0515   CALCIUM 8.2 (L) 11/22/2019 0515   PROT 7.4 11/26/2019 1703   ALBUMIN 4.2 11/24/2019 1703   AST 26 11/28/2019 1703   ALT 14 11/09/2019 1703   ALKPHOS 63 12/05/2019 1703   BILITOT 0.7 11/22/2019 1703   GFRNONAA 39 (L) 11/22/2019 0515   GFRAA 50 (L) 10/21/2017 1040   Lipase     Component Value Date/Time   LIPASE 35 11/21/2019 1703        Studies/Results: DG CHEST PORT 1 VIEW  Result Date: 11/21/2019 CLINICAL DATA:  Fever EXAM: PORTABLE CHEST 1 VIEW COMPARISON:  11/14/2019 FINDINGS: Heart is normal size. No confluent opacities or effusions. Aortic atherosclerosis. NG tube is in the stomach. IMPRESSION: No acute cardiopulmonary disease. Electronically Signed   By: Charlett Nose M.D.   On: 11/21/2019 17:00   DG Abd Portable 1V  Result Date: 11/22/2019 CLINICAL DATA:  Small bowel obstruction EXAM: PORTABLE ABDOMEN - 1 VIEW COMPARISON:  Two days ago FINDINGS: The enteric tube tip is at the proximal stomach. Dilated small bowel in the left abdomen, up to 4 cm in diameter. Oral contrast has diffused from prior. The number of affected loops is diminished. No concerning mass effect or gas collection IMPRESSION: Partial normalization of small bowel distension. Electronically Signed   By: Marnee Spring M.D.   On: 11/22/2019 05:37   DG Abd Portable 1V-Small Bowel Obstruction Protocol-initial, 8 hr delay  Result Date: 11/20/2019 CLINICAL DATA:  Small bowel obstruction. EXAM: PORTABLE ABDOMEN - 1 VIEW COMPARISON:  11/20/2019 at 10:39 a.m. FINDINGS: An enteric tube terminates in the gastric fundus. The stomach is moderately distended by oral contrast material, and there is a small to moderate-sized sliding hiatal hernia. Oral contrast is present in 2  loops of dilated proximal small bowel, and there is persistent mild dilatation of multiple gas-filled small bowel loops more distally in the central and left lower abdomen. Excreted contrast material is noted in the bladder. IMPRESSION: Persistent small bowel dilatation consistent with obstruction. Oral contrast in the stomach and proximal small bowel. Electronically Signed   By: Sebastian Ache M.D.   On: 11/20/2019 21:14   DG Abd Portable 1V  Result Date: 11/20/2019 CLINICAL DATA:  Repositioned nasal/orogastric tube. EXAM: PORTABLE ABDOMEN - 1 VIEW COMPARISON:  11/20/2019 at 9:18 a.m.  FINDINGS: Nasogastric tube has been further inserted, now extending well below the diaphragm to curl within the stomach. There are loops of mildly dilated small bowel in the central to left mid to lower abdomen. IMPRESSION: 1. Well-positioned nasal/orogastric tube. Electronically Signed   By: Amie Portland M.D.   On: 11/20/2019 11:18   DG Abd Portable 1V-Small Bowel Protocol-Position Verification  Result Date: 11/20/2019 CLINICAL DATA:  NG tube placement. EXAM: PORTABLE ABDOMEN - 1 VIEW COMPARISON:  11/20/2019 at 4:35 a.m. FINDINGS: Nasogastric tube has its tip just below the level of the carina, consistent with a mid esophageal position. IMPRESSION: 1. Nasal/orogastric tube tip projects in the midthoracic esophagus. This will need to be further inserted, 18-20 cm, to allow the tip to fully into the stomach. Electronically Signed   By: Amie Portland M.D.   On: 11/20/2019 11:17    Anti-infectives: Anti-infectives (From admission, onward)   Start     Dose/Rate Route Frequency Ordered Stop   11/21/19 0900  metroNIDAZOLE (FLAGYL) IVPB 500 mg        500 mg 100 mL/hr over 60 Minutes Intravenous Every 8 hours 11/21/19 0820     11/20/19 0900  cefTRIAXone (ROCEPHIN) 2 g in sodium chloride 0.9 % 100 mL IVPB        2 g 200 mL/hr over 30 Minutes Intravenous Every 24 hours 11/20/19 0831         Assessment/Plan AKI on CKDIII HLD HTN Code status DNR  Partial SBO - Clamp NG tube and allow clear liquids. If she develops worsening abdominal pain/distension or nausea return NG to LIWS. Needs to mobilize/get OOB. Will ask PT to see.  ID - rocephin/flagyl 10/16>> FEN - IVF, CLD, clamp NG VTE - SCDs, ok for chemical DVT prophylaxis from surgical standpoint Foley - none Follow up - TBD   LOS: 3 days    Franne Forts, [facility] 11/22/2019, 9:03 AM Please see Amion for pager number during day hours 7:00am-4:30pm

## 2019-11-22 NOTE — Evaluation (Signed)
Physical Therapy Evaluation Patient Details Name: Ashlee Cobb MRN: [id number] DOB: 09-12-1923 Today's Date: 11/22/2019   History of Present Illness  Patient is a 86 y.o. female, with a history of asthma, COPD, hyperlipidemia, HTN, presenting to the ED on 10/15 with abdominal pain. In Ed pt found to have partial SBO.    Clinical Impression  Ashlee Cobb is 86 y.o. female admitted with above HPI and diagnosis. Patient is currently limited by functional impairments below (see PT problem list). Patient lives at [facility] and reports independence with RW for mobility but assist needed for ADL's. She currently requires Min-Mod Assist for transfers and gait with RW. Patient transfers bed>BSC>recliner and declined to ambulate greater distance today due to fatigue. Patient will benefit from continued skilled PT interventions to address impairments and progress independence with mobility, recommending HHPT follow up at ALF. Acute PT will follow and progress as able.     Follow Up Recommendations Home health PT (HHPT at [facility])    Equipment Recommendations  None recommended by PT    Recommendations for Other Services       Precautions / Restrictions Precautions Precautions: Fall Restrictions Weight Bearing Restrictions: No      Mobility  Bed Mobility Overal bed mobility: Needs Assistance Bed Mobility: Supine to Sit     Supine to sit: Min assist;HOB elevated     General bed mobility comments: cues for bringing LE's off EOB and using bed rail to assist. Min assist to raise trunk upright and scoot to EOB.   Transfers Overall transfer level: Needs assistance Equipment used: Rolling walker (2 wheeled);1 person hand held assist Transfers: Sit to/from [employer] Sit to Stand: Mod assist;Min assist Stand pivot transfers: Mod assist       General transfer comment: Mod assist for power up and rise from EOB; pt unsteady in standing and required cues for  stepping laterally to [facility]. Min-mod assist to rise from [facility] with RW, cues for safe hand placement.   Ambulation/Gait Ambulation/Gait assistance: Min assist Gait Distance (Feet): 6 Feet Assistive device: Rolling walker (2 wheeled) Gait Pattern/deviations: Step-through pattern;Decreased step length - right;Decreased step length - left;Decreased stride length;Trunk flexed;Shuffle Gait velocity: decr   General Gait Details: cues for safe hand placement on RW, assist to maintain safe position to walker. pt with flexed posture despite cues.  Stairs            Wheelchair Mobility    Modified Rankin (Stroke Patients Only)       Balance Overall balance assessment: Needs assistance Sitting-balance support: Feet supported;Single extremity supported;Bilateral upper extremity supported Sitting balance-Leahy Scale: Fair     Standing balance support: During functional activity;Bilateral upper extremity supported Standing balance-Leahy Scale: Poor Standing balance comment: reliant on external support                             Pertinent Vitals/Pain Pain Assessment: Faces Faces Pain Scale: Hurts a little bit Pain Location: abdomen Pain Descriptors / Indicators: Discomfort Pain Intervention(s): Limited activity within patient's tolerance;Monitored during session;Repositioned    Home Living Family/patient expects to be discharged to:: Assisted living               Home Equipment: Walker - 2 wheels;Cane - single point      Prior Function Level of Independence: Needs assistance         Comments: pt reports using RW at [facility], states she needs assist  for bathing and they provide meals/food.     Hand Dominance        Extremity/Trunk Assessment   Upper Extremity Assessment Upper Extremity Assessment: Generalized weakness    Lower Extremity Assessment Lower Extremity Assessment: Generalized weakness    Cervical / Trunk Assessment Cervical /  Trunk Assessment: Kyphotic  Communication   Communication: HOH  Cognition Arousal/Alertness: Awake/alert Behavior During Therapy: WFL for tasks assessed/performed Overall Cognitive Status: Within Functional Limits for tasks assessed                                        General Comments      Exercises     Assessment/Plan    PT Assessment Patient needs continued PT services  PT Problem List Decreased strength;Decreased activity tolerance;Decreased mobility;Decreased balance;Decreased knowledge of use of DME;Decreased knowledge of precautions       PT Treatment Interventions DME instruction;Gait training;Functional mobility training;Therapeutic activities;Therapeutic exercise;Balance training;Patient/family education    PT Goals (Current goals can be found in the Care Plan section)  Acute Rehab PT Goals Patient Stated Goal: return home PT Goal Formulation: With patient Time For Goal Achievement: 12/06/19 Potential to Achieve Goals: Good    Frequency Min 3X/week   Barriers to discharge        Co-evaluation               AM-PAC PT "6 Clicks" Mobility  Outcome Measure Help needed turning from your back to your side while in a flat bed without using bedrails?: A Little Help needed moving from lying on your back to sitting on the side of a flat bed without using bedrails?: A Little Help needed moving to and from a bed to a chair (including a wheelchair)?: A Lot Help needed standing up from a chair using your arms (e.g., wheelchair or bedside chair)?: A Lot Help needed to walk in hospital room?: A Lot Help needed climbing 3-5 steps with a railing? : A Lot 6 Click Score: 14    End of Session Equipment Utilized During Treatment: Gait belt Activity Tolerance: Patient tolerated treatment well Patient left: in chair;with call bell/phone within reach;with chair alarm set Nurse Communication: Mobility status PT Visit Diagnosis: Muscle weakness  (generalized) (M62.81);Difficulty in walking, not elsewhere classified (R26.2)    Time: 9381-0175 PT Time Calculation (min) (ACUTE ONLY): 28 min   Charges:   PT Evaluation $PT Eval Low Complexity: 1 Low PT Treatments $Therapeutic Activity: 8-22 mins        Wynn Maudlin, DPT Acute Rehabilitation Services  Office [phone] Pager [phone]  11/22/2019 12:05 PM

## 2019-11-22 NOTE — Progress Notes (Signed)
Patient has been tolerating clear liquid diet. No abdominal pain or c/o nausea. NG tube has been clamped since early this AM. Notified Brooke Meuth, PA orders to connect back to suction and if less than 200 cc output to pull NG tube. Connected tube back to suction and out put was 100 cc. Pulled NG tube. Meuth, PA notified and aware.

## 2019-11-22 NOTE — Progress Notes (Signed)
PROGRESS NOTE    Ashlee Cobb  ZOX:[id number] DOB: 10/27/1923 DOA: 11/09/2019 PCP: Gweneth DimitriMcNeill, Wendy, MD    Brief Narrative:  86 year old female with history of hypertension and hyperlipidemia from [facility] admitted with abdominal pain and found to have partial small bowel obstruction.  She is treated conservatively with NG tube suction and followed by surgery.  Some improvement today. 10/17, reported low-grade temperature and started on antibiotics.   Assessment & Plan:   Principal Problem:   Partial small bowel obstruction (HCC) Active Problems:   Hyperlipidemia   Hypertension  Partial small bowel obstruction: Managed conservatively with IV fluids, n.p.o., NG tube to wall suction with clinical improvement. KUB 10/17 with improvement.  Patient with return of bowel function with bowel movements. As per surgery plan, clamping NG tube today and advancing to clears.  Low-grade temperature, source unknown.  Probably metabolic.  Chest x-ray normal.  Urine is clear.  We will continue 24 hours until clinical improvement.  Hypertension: Patient on ACE inhibitor's at home.  Holding while not having oral intake.  Hyperlipidemia: Statin on hold.  AKI on CKD stage IIIa: Remains on maintenance IV fluids.  At about her baseline.   DVT prophylaxis: SCDs Start: 11/08/2019 2038   Code Status: DNR Family Communication: None Disposition Plan: Status is: Inpatient  Remains inpatient appropriate because:IV treatments appropriate due to intensity of illness or inability to take PO and Inpatient level of care appropriate due to severity of illness   Dispo: The patient is from: ALF              Anticipated d/c is to: ALF              Anticipated d/c date is: 2 days              Patient currently is not medically stable to d/c.         Consultants:   General surgery  Procedures:   None  Antimicrobials:  Anti-infectives (From admission, onward)   Start      Dose/Rate Route Frequency Ordered Stop   11/21/19 0900  metroNIDAZOLE (FLAGYL) IVPB 500 mg        500 mg 100 mL/hr over 60 Minutes Intravenous Every 8 hours 11/21/19 0820     11/20/19 0900  cefTRIAXone (ROCEPHIN) 2 g in sodium chloride 0.9 % 100 mL IVPB        2 g 200 mL/hr over 30 Minutes Intravenous Every 24 hours 11/20/19 0831           Subjective: Patient seen and examined.  She was quite sleepy.  Denied any complaints.  NG tube clamped.  Denies any abdominal pain, however on examination patient stated some pain.  Reportedly she had a large bowel movement today morning and feels relieved.  Objective: Vitals:   11/21/19 0601 11/21/19 1508 11/21/19 2049 11/22/19 0504  BP: (!) 105/39 103/81 125/80 (!) 132/55  Pulse: 77 98 (!) 101 96  Resp: 15  17 17   Temp: 98.6 F (37 C) 98.4 F (36.9 C) 99.1 F (37.3 C) 99.1 F (37.3 C)  TempSrc: Oral Oral Oral   SpO2: 91% (!) 88% 92% (!) 88%  Weight:      Height:        Intake/Output Summary (Last 24 hours) at 11/22/2019 1316 Last data filed at 11/22/2019 0949 Gross per 24 hour  Intake 1665.22 ml  Output 500 ml  Net 1165.22 ml   Filed Weights   12/02/2019 2120  Weight:  69.8 kg    Examination:  General exam: Appears calm and comfortable, appropriate for age.  Looks anxious. Respiratory system: Patient has some dry cough.  She has conducted airway sounds. Cardiovascular system: S1 & S2 heard, RRR. No JVD, murmurs, rubs, gallops or clicks. No pedal edema. Gastrointestinal system: Soft, mildly distended, bowel sounds present.  NG tube clamped. Central nervous system: Alert and oriented. No focal neurological deficits. Extremities: Symmetric 5 x 5 power. Skin: No rashes, lesions or ulcers     Data Reviewed: I have personally reviewed following labs and imaging studies  CBC: Recent Labs  Lab 2019-12-16 1703 12-16-2019 1712 11/20/19 0648 11/21/19 0557 11/22/19 0515  WBC 11.0*  --  13.6* 13.4* 13.0*  NEUTROABS 9.0*  --   --   10.9*  --   HGB 11.4* 12.2 10.4* 9.5* 9.5*  HCT 36.6 36.0 32.9* 30.0* 29.0*  MCV 95.8  --  95.1 94.9 92.7  PLT 316  --  275 235 239   Basic Metabolic Panel: Recent Labs  Lab 12-16-2019 1703 12/16/2019 1712 11/20/19 0648 11/21/19 0557 11/22/19 0515  NA 140 138 139 141 142  K 4.7 5.1 4.8 4.1 3.6  CL 103 104 106 109 109  CO2 24  --  18* 23 21*  GLUCOSE 123* 119* 126* 121* 108*  BUN 30* 36* 26* 41* 43*  CREATININE 1.11* 1.10* 1.13* 1.29* 1.18*  CALCIUM 9.4  --  8.5* 8.2* 8.2*  MG  --   --   --  2.1  --    GFR: Estimated Creatinine Clearance: 27.3 mL/min (A) (by C-G formula based on SCr of 1.18 mg/dL (H)). Liver Function Tests: Recent Labs  Lab 12/16/2019 1703  AST 26  ALT 14  ALKPHOS 63  BILITOT 0.7  PROT 7.4  ALBUMIN 4.2   Recent Labs  Lab December 16, 2019 1703  LIPASE 35   No results for input(s): AMMONIA in the last 168 hours. Coagulation Profile: No results for input(s): INR, PROTIME in the last 168 hours. Cardiac Enzymes: No results for input(s): CKTOTAL, CKMB, CKMBINDEX, TROPONINI in the last 168 hours. BNP (last 3 results) No results for input(s): PROBNP in the last 8760 hours. HbA1C: No results for input(s): HGBA1C in the last 72 hours. CBG: No results for input(s): GLUCAP in the last 168 hours. Lipid Profile: No results for input(s): CHOL, HDL, LDLCALC, TRIG, CHOLHDL, LDLDIRECT in the last 72 hours. Thyroid Function Tests: No results for input(s): TSH, T4TOTAL, FREET4, T3FREE, THYROIDAB in the last 72 hours. Anemia Panel: No results for input(s): VITAMINB12, FOLATE, FERRITIN, TIBC, IRON, RETICCTPCT in the last 72 hours. Sepsis Labs: Recent Labs  Lab 2019-12-16 1703 11/21/19 0557  LATICACIDVEN 1.5 1.3    Recent Results (from the past 240 hour(s))  Respiratory Panel by RT PCR (Flu A&B, Covid) - Nasopharyngeal Swab     Status: None   Collection Time: 12/16/19  5:22 PM   Specimen: Nasopharyngeal Swab  Result Value Ref Range Status   SARS Coronavirus 2 by RT  PCR NEGATIVE NEGATIVE Final    Comment: (NOTE) SARS-CoV-2 target nucleic acids are NOT DETECTED.  The SARS-CoV-2 RNA is generally detectable in upper respiratoy specimens during the acute phase of infection. The lowest concentration of SARS-CoV-2 viral copies this assay can detect is 131 copies/mL. A negative result does not preclude SARS-Cov-2 infection and should not be used as the sole basis for treatment or other patient management decisions. A negative result may occur with  improper specimen collection/handling, submission of specimen  other than nasopharyngeal swab, presence of viral mutation(s) within the areas targeted by this assay, and inadequate number of viral copies (<131 copies/mL). A negative result must be combined with clinical observations, patient history, and epidemiological information. The expected result is Negative.  Fact Sheet for Patients:  https://www.moore.com/  Fact Sheet for Healthcare Providers:  https://www.young.biz/  This test is no t yet approved or cleared by the [place] FDA and  has been authorized for detection and/or diagnosis of SARS-CoV-2 by FDA under an Emergency Use Authorization (EUA). This EUA will remain  in effect (meaning this test can be used) for the duration of the COVID-19 declaration under Section 564(b)(1) of the Act, 21 U.S.C. section 360bbb-3(b)(1), unless the authorization is terminated or revoked sooner.     Influenza A by PCR NEGATIVE NEGATIVE Final   Influenza B by PCR NEGATIVE NEGATIVE Final    Comment: (NOTE) The Xpert Xpress SARS-CoV-2/FLU/RSV assay is intended as an aid in  the diagnosis of influenza from Nasopharyngeal swab specimens and  should not be used as a sole basis for treatment. Nasal washings and  aspirates are unacceptable for Xpert Xpress SARS-CoV-2/FLU/RSV  testing.  Fact Sheet for Patients: https://www.moore.com/  Fact Sheet for  Healthcare Providers: https://www.young.biz/  This test is not yet approved or cleared by the [place] FDA and  has been authorized for detection and/or diagnosis of SARS-CoV-2 by  FDA under an Emergency Use Authorization (EUA). This EUA will remain  in effect (meaning this test can be used) for the duration of the  Covid-19 declaration under Section 564(b)(1) of the Act, 21  U.S.C. section 360bbb-3(b)(1), unless the authorization is  terminated or revoked. Performed at [facility], [address] [place], [place] [address]   Culture, blood (routine x 2)     Status: None (Preliminary result)   Collection Time: 11/21/19  8:45 AM   Specimen: BLOOD RIGHT HAND  Result Value Ref Range Status   Specimen Description   Final    BLOOD RIGHT HAND Performed at [facility], [address] [place], [place] [address]    Special Requests   Final    BOTTLES DRAWN AEROBIC ONLY Blood Culture adequate volume Performed at [facility], [address] [place], [place] [address]    Culture   Final    NO GROWTH < 24 HOURS Performed at [facility], [address], [place], [place] [address]    Report Status PENDING  Incomplete  Culture, blood (routine x 2)     Status: None (Preliminary result)   Collection Time: 11/21/19  8:45 AM   Specimen: BLOOD RIGHT HAND  Result Value Ref Range Status   Specimen Description   Final    BLOOD RIGHT HAND Performed at [facility], [address], [place], [place] [address]    Special Requests   Final    BOTTLES DRAWN AEROBIC ONLY Blood Culture adequate volume Performed at [facility], [address] [place], [place] [address]    Culture   Final    NO GROWTH < 24 HOURS Performed at [facility], [address], [place], [place] [address]    Report Status PENDING  Incomplete         Radiology Studies: DG CHEST PORT 1 VIEW  Result Date:  11/21/2019 CLINICAL DATA:  Fever EXAM: PORTABLE CHEST 1 VIEW COMPARISON:  11/18/2019 FINDINGS: Heart is normal size. No confluent opacities or effusions. Aortic  atherosclerosis. NG tube is in the stomach. IMPRESSION: No acute cardiopulmonary disease. Electronically Signed   By: Charlett Nose M.D.   On: 11/21/2019 17:00   DG Abd Portable 1V  Result Date: 11/22/2019 CLINICAL DATA:  Small bowel obstruction EXAM: PORTABLE ABDOMEN - 1 VIEW COMPARISON:  Two days ago FINDINGS: The enteric tube tip is at the proximal stomach. Dilated small bowel in the left abdomen, up to 4 cm in diameter. Oral contrast has diffused from prior. The number of affected loops is diminished. No concerning mass effect or gas collection IMPRESSION: Partial normalization of small bowel distension. Electronically Signed   By: Marnee Spring M.D.   On: 11/22/2019 05:37   DG Abd Portable 1V-Small Bowel Obstruction Protocol-initial, 8 hr delay  Result Date: 11/20/2019 CLINICAL DATA:  Small bowel obstruction. EXAM: PORTABLE ABDOMEN - 1 VIEW COMPARISON:  11/20/2019 at 10:39 a.m. FINDINGS: An enteric tube terminates in the gastric fundus. The stomach is moderately distended by oral contrast material, and there is a small to moderate-sized sliding hiatal hernia. Oral contrast is present in 2 loops of dilated proximal small bowel, and there is persistent mild dilatation of multiple gas-filled small bowel loops more distally in the central and left lower abdomen. Excreted contrast material is noted in the bladder. IMPRESSION: Persistent small bowel dilatation consistent with obstruction. Oral contrast in the stomach and proximal small bowel. Electronically Signed   By: Sebastian Ache M.D.   On: 11/20/2019 21:14        Scheduled Meds: . fentaNYL (SUBLIMAZE) injection  50 mcg Intravenous Once   Continuous Infusions: . cefTRIAXone (ROCEPHIN)  IV 2 g (11/22/19 1039)  . metronidazole 500 mg (11/22/19 6378)     LOS: 3 days    Time  spent: 25 minutes    Dorcas Carrow, MD [facility] Pager [phone]

## 2019-11-22 NOTE — TOC Initial Note (Signed)
Transition of Care [facility]) - Initial/Assessment Note    Patient Details  Name: Ashlee Cobb MRN: [id number] Date of Birth: February 14, 1923  [facility]) CM/SW Contact:    Lennart Pall, LCSW Phone Number: 11/22/2019, 12:41 PM  Clinical Narrative:                 Met with pt to introduce role and discuss potential dc needs.  Pt confirms that she has been a resident at [facility] for ~ 12 yrs [phone]).  She has no concerns about returning to facility.  Per discussion with pt, sister and facility liaison, they report that pt was independent with mobility and ADLs PTA.  Will await PT evaluation to determine if needs are changed, however, anticipate she will likely be able to just return directly to ALF.  TOC to continue to follow.  Expected Discharge Plan: Assisted Living Barriers to Discharge: Continued Medical Work up   Patient Goals and CMS Choice Patient states their goals for this hospitalization and ongoing recovery are:: be able to return to ALF      Expected Discharge Plan and Services Expected Discharge Plan: Assisted Living In-house Referral: Clinical Social Work     Living arrangements for the past 2 months: [place]                                      Prior Living Arrangements/Services Living arrangements for the past 2 months: [place] Lives with:: Facility Resident Patient language and need for interpreter reviewed:: Yes Do you feel safe going back to the place where you live?: Yes      Need for Family Participation in Patient Care: No (Comment) Care giver support system in place?: Yes (comment)   Criminal Activity/Legal Involvement Pertinent to Current Situation/Hospitalization: No - Comment as needed  Activities of Daily Living Home Assistive Devices/Equipment: Environmental consultant (specify type), Dentures (specify type), Shower chair with back (full upper plate, lower partial plate) ADL Screening (condition at time of  admission) Patient's cognitive ability adequate to safely complete daily activities?: Yes Is the patient deaf or have difficulty hearing?: No Does the patient have difficulty seeing, even when wearing glasses/contacts?: No Does the patient have difficulty concentrating, remembering, or making decisions?: No Patient able to express need for assistance with ADLs?: Yes Does the patient have difficulty dressing or bathing?: No Independently performs ADLs?: Yes (appropriate for developmental age) (uses walker) Does the patient have difficulty walking or climbing stairs?: Yes Weakness of Legs: Both Weakness of Arms/Hands: Both  Permission Sought/Granted Permission sought to share information with : Facility Sport and exercise psychologist, Family Supports Permission granted to share information with : Yes, Verbal Permission Granted  Share Information with NAME: sister, Brayton El @ [phone]  Permission granted to share info w AGENCY: Doctor, general practice at [employer] [phone]        Emotional Assessment Appearance:: Appears stated age Attitude/Demeanor/Rapport: Gracious Affect (typically observed): Accepting, Quiet Orientation: : Oriented to Self, Oriented to Place, Oriented to  Time, Oriented to Situation   Psych Involvement: No (comment)  Admission diagnosis:  SBO (small bowel obstruction) (HCC) [K56.609] Partial small bowel obstruction ([place]) [K56.600] Patient Active Problem List   Diagnosis Date Noted  . Partial small bowel obstruction ([place]) 11/30/2019  . Hyperlipidemia   . Hypertension   . Syncope 07/17/2014   PCP:  Cari Caraway, MD Pharmacy:  No Pharmacies Listed  Social Determinants of Health (SDOH) Interventions    Readmission Risk Interventions Readmission Risk Prevention Plan 11/22/2019  Post Dischage Appt Complete  Medication Screening Complete  Transportation Screening Complete  Some recent data might be hidden

## 2019-11-22 NOTE — Care Management Important Message (Signed)
Important Message  Patient Details IM Letter given to the Patient Name: Ashlee Cobb MRN: [id number] Date of Birth: 04/05/1923   Medicare Important Message Given:  Yes     Caren Macadam 11/22/2019, 11:46 AM

## 2019-11-23 ENCOUNTER — Inpatient Hospital Stay (HOSPITAL_COMMUNITY): Payer: Medicare Other

## 2019-11-23 DIAGNOSIS — R9431 Abnormal electrocardiogram [ECG] [EKG]: Secondary | ICD-10-CM

## 2019-11-23 DIAGNOSIS — K56600 Partial intestinal obstruction, unspecified as to cause: Principal | ICD-10-CM

## 2019-11-23 DIAGNOSIS — E785 Hyperlipidemia, unspecified: Secondary | ICD-10-CM

## 2019-11-23 DIAGNOSIS — I340 Nonrheumatic mitral (valve) insufficiency: Secondary | ICD-10-CM | POA: Diagnosis not present

## 2019-11-23 DIAGNOSIS — I361 Nonrheumatic tricuspid (valve) insufficiency: Secondary | ICD-10-CM | POA: Diagnosis not present

## 2019-11-23 DIAGNOSIS — R7989 Other specified abnormal findings of blood chemistry: Secondary | ICD-10-CM

## 2019-11-23 DIAGNOSIS — I10 Essential (primary) hypertension: Secondary | ICD-10-CM | POA: Diagnosis not present

## 2019-11-23 LAB — COMPREHENSIVE METABOLIC PANEL
ALT: 34 U/L (ref 0–44)
AST: 75 U/L — ABNORMAL HIGH (ref 15–41)
Albumin: 2.4 g/dL — ABNORMAL LOW (ref 3.5–5.0)
Alkaline Phosphatase: 46 U/L (ref 38–126)
Anion gap: 10 (ref 5–15)
BUN: 52 mg/dL — ABNORMAL HIGH (ref 8–23)
CO2: 21 mmol/L — ABNORMAL LOW (ref 22–32)
Calcium: 7.9 mg/dL — ABNORMAL LOW (ref 8.9–10.3)
Chloride: 109 mmol/L (ref 98–111)
Creatinine, Ser: 1.42 mg/dL — ABNORMAL HIGH (ref 0.44–1.00)
GFR, Estimated: 32 mL/min — ABNORMAL LOW (ref >60)
Glucose, Bld: 156 mg/dL — ABNORMAL HIGH (ref 70–99)
Potassium: 3.2 mmol/L — ABNORMAL LOW (ref 3.5–5.1)
Sodium: 140 mmol/L (ref 135–145)
Total Bilirubin: 1.3 mg/dL — ABNORMAL HIGH (ref 0.3–1.2)
Total Protein: 5.2 g/dL — ABNORMAL LOW (ref 6.5–8.1)

## 2019-11-23 LAB — CBC WITH DIFFERENTIAL/PLATELET
Abs Immature Granulocytes: 0.04 10*3/uL (ref 0.00–0.07)
Basophils Absolute: 0 10*3/uL (ref 0.0–0.1)
Basophils Relative: 1 %
Eosinophils Absolute: 0 10*3/uL (ref 0.0–0.5)
Eosinophils Relative: 0 %
HCT: 27.5 % — ABNORMAL LOW (ref 36.0–46.0)
Hemoglobin: 8.7 g/dL — ABNORMAL LOW (ref 12.0–15.0)
Immature Granulocytes: 1 %
Lymphocytes Relative: 6 %
Lymphs Abs: 0.3 10*3/uL — ABNORMAL LOW (ref 0.7–4.0)
MCH: 29.5 pg (ref 26.0–34.0)
MCHC: 31.6 g/dL (ref 30.0–36.0)
MCV: 93.2 fL (ref 80.0–100.0)
Monocytes Absolute: 0.2 10*3/uL (ref 0.1–1.0)
Monocytes Relative: 4 %
Neutro Abs: 4.2 10*3/uL (ref 1.7–7.7)
Neutrophils Relative %: 88 %
Platelets: 249 10*3/uL (ref 150–400)
RBC: 2.95 MIL/uL — ABNORMAL LOW (ref 3.87–5.11)
RDW: 15.9 % — ABNORMAL HIGH (ref 11.5–15.5)
WBC: 4.7 10*3/uL (ref 4.0–10.5)
nRBC: 0.4 % — ABNORMAL HIGH (ref 0.0–0.2)

## 2019-11-23 LAB — GLUCOSE, CAPILLARY
Glucose-Capillary: 78 mg/dL (ref 70–99)
Glucose-Capillary: 107 mg/dL — ABNORMAL HIGH (ref 70–99)

## 2019-11-23 LAB — ECHOCARDIOGRAM COMPLETE
Area-P 1/2: 2.73 cm2
Calc EF: 70.3 %
Height: 63 in
S' Lateral: 2.4 cm
Single Plane A2C EF: 77.2 %
Single Plane A4C EF: 67.3 %
Weight: 2462.1 oz

## 2019-11-23 LAB — MAGNESIUM: Magnesium: 2.2 mg/dL (ref 1.7–2.4)

## 2019-11-23 LAB — TROPONIN I (HIGH SENSITIVITY)
Troponin I (High Sensitivity): 3279 ng/L (ref <18)
Troponin I (High Sensitivity): 3720 ng/L (ref <18)
Troponin I (High Sensitivity): 4391 ng/L (ref <18)

## 2019-11-23 LAB — MRSA PCR SCREENING: MRSA by PCR: POSITIVE — AB

## 2019-11-23 LAB — LACTIC ACID, PLASMA
Lactic Acid, Venous: 1.6 mmol/L (ref 0.5–1.9)
Lactic Acid, Venous: 1.4 mmol/L (ref 0.5–1.9)

## 2019-11-23 LAB — PHOSPHORUS: Phosphorus: 2.8 mg/dL (ref 2.5–4.6)

## 2019-11-23 MED ORDER — SODIUM CHLORIDE 0.9 % IV BOLUS: 500.0000 mL | Freq: Once | INTRAVENOUS | Status: DC | PRN

## 2019-11-23 MED ORDER — ACETAMINOPHEN 650 MG RE SUPP
650.0000 mg | RECTAL | Status: DC | PRN
  Administered 2019-11-23: 09:00:00 650 mg via RECTAL
  Filled 2019-11-23: qty 1

## 2019-11-23 MED ORDER — LORAZEPAM 2 MG/ML IJ SOLN: 0.5000 mg | Freq: Four times a day (QID) | INTRAMUSCULAR | Status: DC | PRN

## 2019-11-23 MED ORDER — SODIUM CHLORIDE 0.9 % IV BOLUS
1000.0000 mL | Freq: Once | INTRAVENOUS | Status: AC
  Administered 2019-11-23: 09:00:00 1000 mL via INTRAVENOUS

## 2019-11-23 MED ORDER — POTASSIUM CHLORIDE 10 MEQ/100ML IV SOLN
10.0000 meq | INTRAVENOUS | Status: AC
  Administered 2019-11-23 (×5): 10 meq via INTRAVENOUS
  Filled 2019-11-23 (×5): qty 100

## 2019-11-23 MED ORDER — DEXTROSE 50 % IV SOLN
25.0000 mL | Freq: Once | INTRAVENOUS | Status: AC
  Administered 2019-11-23: 09:00:00 25 mL via INTRAVENOUS

## 2019-11-23 MED ORDER — MORPHINE SULFATE (PF) 2 MG/ML IV SOLN: 2.0000 mg | INTRAVENOUS | Status: DC | PRN

## 2019-11-23 MED ORDER — ASPIRIN 300 MG RE SUPP
300.0000 mg | Freq: Every day | RECTAL | Status: DC
  Administered 2019-11-23: 300 mg via RECTAL
  Filled 2019-11-23: qty 1

## 2019-11-23 MED ORDER — CHLORHEXIDINE GLUCONATE CLOTH 2 % EX PADS
6.0000 | MEDICATED_PAD | Freq: Every day | CUTANEOUS | Status: DC
  Administered 2019-11-23: 6 via TOPICAL

## 2019-11-24 DIAGNOSIS — I214 Non-ST elevation (NSTEMI) myocardial infarction: Secondary | ICD-10-CM | POA: Diagnosis not present

## 2019-11-26 LAB — CULTURE, BLOOD (ROUTINE X 2)
Culture: NO GROWTH
Culture: NO GROWTH
Special Requests: ADEQUATE
Special Requests: ADEQUATE

## 2019-11-28 LAB — CULTURE, BLOOD (ROUTINE X 2)
Culture: NO GROWTH
Culture: NO GROWTH
Special Requests: ADEQUATE
Special Requests: ADEQUATE

## 2019-12-06 NOTE — Consult Note (Addendum)
Cardiology Consultation:   Patient ID: BERNISE SYLVAIN MRN: [id number]; DOB: 08/19/1923  Admit date: 10-Dec-2019 Date of Consult: 11/08/2019  Primary Care Provider: Gweneth Dimitri, MD [facility] Cardiologist: New (Dr. Shari Prows) [facility] Electrophysiologist:  None    Patient Profile:   LACINDA CURVIN is a 86 y.o. female with a history of COPD, asthma, hypertension, and hyperlipidemia who is being seen today for the evaluation of elevated troponin at the request of Dr. Jerral Ralph.  History of Present Illness:   Ms. Arrey is a 86 year old female with the above history. No known cardiac history.   Patient was sent to the [facility] ED on 10-Dec-2019 from PCP's office for further evaluation of sudden onset of severe abdominal pain. She was ultimately found to have a partial small bowel obstruction. General Surgery was consulted and recommended attempt at conservative management with bowel rest, NG decompression, and IV rehydration. After admission, she was noted to have a low grade temperature and was started on antibiotics.   This morning patient found unresponsive and staring at the ceiling. She was reportedly "belly breathing rapidly" and was not responding to her name. She was found to be mildly hypotensive with BP of 96/51, mildly tachycardic, and febrile with temp of 101.9.  Rapid Response was called and ordered 500 mL bolus. After bolus, patient started responding to commands. EKG showed mild sinus tachycardia with a lot of baseline artifact but looks like ST depression in anterior leads. Troponin was checked and came back markedly elevated at 3,000. Therefore, Cardiology consulted for further evaluation.  At the time of this evaluation, patient still lethargic. She can follow commands but is unable to answer any questions. She is staring at the ceiling. She was able to shake her head no when I asked if she was having any pain anywhere. Unable to get any other history from patient.  Sister, who is her healthcare power of attorney is at beside. Sister reports she has no history of heart disease and has not complained of any cardiac symptoms including chest pain or shortness of breath. She is a long-time smoker (since her teens) and still smokes at least 1 pack per day.   Past Medical History:  Diagnosis Date  . Asthma   . COPD (chronic obstructive pulmonary disease) (HCC)   . Hyperlipidemia   . Hypertension     Past Surgical History:  Procedure Laterality Date  . ABDOMINAL HYSTERECTOMY         Inpatient Medications: Scheduled Meds: . aspirin  300 mg Rectal Daily  . Chlorhexidine Gluconate Cloth  6 each Topical Daily   Continuous Infusions: . cefTRIAXone (ROCEPHIN)  IV 2 g (11/12/2019 1234)  . metronidazole Stopped (11/18/2019 1209)  . potassium chloride 10 mEq (11/18/2019 1234)  . sodium chloride     PRN Meds: acetaminophen, morphine injection, ondansetron **OR** ondansetron (ZOFRAN) IV, sodium chloride  Allergies:   No Known Allergies  Social History:   Social History   Socioeconomic History  . Marital status: Widowed    Spouse name: Not on file  . Number of children: Not on file  . Years of education: Not on file  . Highest education level: Not on file  Occupational History  . Not on file  Tobacco Use  . Smoking status: Current Every Day Smoker    Packs/day: 1.00    Years: 60.00    Pack years: 60.00    Types: Cigarettes  . Smokeless tobacco: Never Used  Vaping Use  .  Vaping Use: Never used  Substance and Sexual Activity  . Alcohol use: Not Currently    Alcohol/week: 1.0 standard drink    Types: 1 Standard drinks or equivalent per week  . Drug use: Never  . Sexual activity: Not Currently  Other Topics Concern  . Not on file  Social History Narrative  . Not on file   Social Determinants of Health   Financial Resource Strain:   . Difficulty of Paying Living Expenses: Not on file  Food Insecurity:   . Worried About Programme researcher, broadcasting/film/videounning Out of Food  in the Last Year: Not on file  . Ran Out of Food in the Last Year: Not on file  Transportation Needs:   . Lack of Transportation (Medical): Not on file  . Lack of Transportation (Non-Medical): Not on file  Physical Activity:   . Days of Exercise per Week: Not on file  . Minutes of Exercise per Session: Not on file  Stress:   . Feeling of Stress : Not on file  Social Connections:   . Frequency of Communication with Friends and Family: Not on file  . Frequency of Social Gatherings with Friends and Family: Not on file  . Attends Religious Services: Not on file  . Active Member of Clubs or Organizations: Not on file  . Attends BankerClub or Organization Meetings: Not on file  . Marital Status: Not on file  Intimate Partner Violence:   . Fear of Current or Ex-Partner: Not on file  . Emotionally Abused: Not on file  . Physically Abused: Not on file  . Sexually Abused: Not on file    Family History:   Family History  Problem Relation Age of Onset  . Cirrhosis Father      ROS:  Please see the history of present illness.  Review of Systems  Unable to perform ROS: Mental status change    Physical Exam/Data:   Vitals:   2019/08/14 0758 2019/08/14 0900 2019/08/14 0951 2019/08/14 1100  BP:  (!) 102/48 (!) 110/39 (!) 94/35  Pulse:   95 95  Resp:   (!) 25 (!) 26  Temp:   100.2 F (37.9 C)   TempSrc:   Rectal   SpO2: 95%  95% 97%  Weight:      Height:        Intake/Output Summary (Last 24 hours) at 02/06/2019 1254 Last data filed at 02/06/2019 0600 Gross per 24 hour  Intake 402.26 ml  Output 100 ml  Net 302.26 ml   Last 3 Weights 11/29/2019 10/21/2017 07/18/2014  Weight (lbs) 153 lb 14.1 oz 143 lb 4.8 oz 146 lb 9.7 oz  Weight (kg) 69.8 kg 65 kg 66.5 kg     Body mass index is 27.26 kg/m.  General: 86 y.o. female resting comfortably in no acute distress. HEENT: Normocephalic and atraumatic. Sclera clear.  Neck: Supple. Possible mildly elevated JVD. Heart: RRR. Distinct S1 and S2. No  murmurs, gallops, or rubs. Radial pulses 2+ and equal bilaterally. Lungs: Tachypneic with mild increased work of breathing. Clear to ausculation bilaterally. Possible mild crackles in bases but patient unable to sit up for complete assessment. Abdomen: Soft, mildly distended, and non-tender to palpation. Bowel sounds present. MSK: Generalized weakness.  Extremities: No lower extremity edema.    Skin: Warm and dry. Neuro: Lethargic. Follows commands but unable to answer any questions. Psych: Lethargic.    EKG:  The EKG was personally reviewed and demonstrates:  Sinus tachycardia with a lot of baseline artifact but looks  like ST depression in anterior leads. Telemetry:  Telemetry was personally reviewed and demonstrates:  Normal sinus rhythm with PACs. Rates in the 90's.   Relevant CV Studies: Echo ordered.  Laboratory Data:  High Sensitivity Troponin:   Recent Labs  Lab 12/04/2019 1703 12-15-19 0847 December 15, 2019 1039  TROPONINIHS 8 3,279* 3,720*     Chemistry Recent Labs  Lab 11/21/19 0557 11/22/19 0515 15-Dec-2019 0847  NA 141 142 140  K 4.1 3.6 3.2*  CL 109 109 109  CO2 23 21* 21*  GLUCOSE 121* 108* 156*  BUN 41* 43* 52*  CREATININE 1.29* 1.18* 1.42*  CALCIUM 8.2* 8.2* 7.9*  GFRNONAA 35* 39* 32*  ANIONGAP Recent Labs  Lab 11/21/2019 1703 12-15-2019 0847  PROT 7.4 5.2*  ALBUMIN 4.2 2.4*  AST 26 75*  ALT 14 34  ALKPHOS 63 46  BILITOT 0.7 1.3*   Hematology Recent Labs  Lab 11/21/19 0557 11/22/19 0515 12/15/19 0847  WBC 13.4* 13.0* 4.7  RBC 3.16* 3.13* 2.95*  HGB 9.5* 9.5* 8.7*  HCT 30.0* 29.0* 27.5*  MCV 94.9 92.7 93.2  MCH 30.1 30.4 29.5  MCHC 31.7 32.8 31.6  RDW 15.3 15.5 15.9*  PLT 235 239 249   BNPNo results for input(s): BNP, PROBNP in the last 168 hours.  DDimer No results for input(s): DDIMER in the last 168 hours.   Radiology/Studies:  CT CHEST WO CONTRAST  Result Date: Dec 15, 2019 CLINICAL DATA:  Chest pain, shortness of breath. EXAM:  CT CHEST WITHOUT CONTRAST TECHNIQUE: Multidetector CT imaging of the chest was performed following the standard protocol without IV contrast. COMPARISON:  Radiograph of same day. FINDINGS: Cardiovascular: Atherosclerosis of thoracic aorta is noted without aneurysm formation. Mild cardiomegaly is noted. No pericardial effusion is noted. Coronary artery calcifications are noted. Mediastinum/Nodes: Thyroid gland is unremarkable. No adenopathy is noted. Large sliding-type hiatal hernia is noted. Moderate dilatation of the more proximal esophagus is noted which is fluid-filled. Lungs/Pleura: No pneumothorax is noted. Small bilateral pleural effusions are noted with adjacent subsegmental atelectasis of both lower lobes. Mild biapical scarring is noted. Upper Abdomen: No acute abnormality. Musculoskeletal: No chest wall mass or suspicious bone lesions identified. IMPRESSION: 1. Coronary artery calcifications are noted suggesting coronary artery disease. 2. Large sliding-type hiatal hernia is noted. Moderate dilatation of the more proximal esophagus is noted which is fluid-filled. 3. Small bilateral pleural effusions are noted with adjacent subsegmental atelectasis of both lower lobes. 4. Aortic atherosclerosis. Aortic Atherosclerosis (ICD10-I70.0). Electronically Signed   By: Lupita Raider M.D.   On: 12/15/2019 10:34   CT ABDOMEN PELVIS W CONTRAST  Result Date: 11/11/2019 CLINICAL DATA:  Acute lower abdominal pain none EXAM: CT ABDOMEN AND PELVIS WITH CONTRAST TECHNIQUE: Multidetector CT imaging of the abdomen and pelvis was performed using the standard protocol following bolus administration of intravenous contrast. CONTRAST:  OMNIPAQUE IOHEXOL 300 MG/ML  SOLN COMPARISON:  None. FINDINGS: Lower chest: The visualized heart size within normal limits. No pericardial fluid/thickening. A small hiatal hernia is present with fluid reflux in the distal esophagus. The visualized portions of the lungs are clear.  Hepatobiliary: The liver is normal in density without focal abnormality.The main portal vein is patent. The patient is status post cholecystectomy. Pancreas: Unremarkable. No pancreatic ductal dilatation or surrounding inflammatory changes. Spleen: Normal in size without focal abnormality. Adrenals/Urinary Tract: Both adrenal glands appear normal. Multiple bilateral low-density lesions are seen throughout kidneys likely renal cysts the largest in lower pole the right  kidney measures 5.2 cm. There is mild atrophy. There is a punctate calcification in the pole the right kidney. Probable bilateral renal vascular calcifications are. The bladder is. Stomach/Bowel: As above there is a small hiatal hernia present. The small is unremarkable. Within the mid ileal loops in the left lower quadrant there is mildly dilated air and fluid-filled measuring up to 3 cm significant surrounding mesenteric edema and amount loculated fluid. There is a focal area narrowing seen within the left mid abdomen best seen series 2, image 49. There remainder the distal loops decompressed. There is a moderate amount stool present throughout. Vascular/Lymphatic: There are no enlarged mesenteric, retroperitoneal, or pelvic lymph nodes. Scattered aortic atherosclerotic calcifications are seen without aneurysmal dilatation. Reproductive: The patient is status post hysterectomy. No adnexal masses or collections seen. Other: A small amount of abdominopelvic ascites is seen. No abdominal wall hernia is noted. Musculoskeletal: No acute or significant osseous findings. A healed fracture deformity seen within the inferior left pubic rami. IMPRESSION: Findings consistent with a partial small bowel obstruction within the left mid abdomen within the mid ileal loops with a focal area of narrowing as described. Small amount abdominopelvic ascites Hiatal hernia with gastroesophageal reflux Aortic Atherosclerosis (ICD10-I70.0). Electronically Signed   By: Jonna Clark M.D.   On: 12-12-2019 19:05   DG Chest Port 1 View  Result Date: 12/01/2019 CLINICAL DATA:  Respiratory distress EXAM: PORTABLE CHEST 1 VIEW COMPARISON:  November 21, 2019 FINDINGS: Image rotated to the LEFT. Lung volumes are diminished since the previous exam. Blunting of LEFT costo diaphragmatic sulcus and partially obscured RIGHT hemidiaphragm. Patchy opacities in the chest, particularly RIGHT lower lobe and LEFT mid chest. Cardiomediastinal contours and hilar structures stable accounting for low lung volumes and rotated image. Extensive atherosclerotic CIS of the thoracic aorta and carotid arteries. Spinal degenerative changes and signs of prior LEFT humeral fracture. IMPRESSION: Diminished lung volumes with patchy opacities in the chest, particularly RIGHT lower lobe and LEFT mid chest. Findings could be related to multifocal pneumonia or atelectasis in the setting of developing effusions LEFT greater than RIGHT. Atherosclerosis. Aortic Atherosclerosis (ICD10-I70.0). Electronically Signed   By: Donzetta Kohut M.D.   On: 11/14/2019 08:40   DG CHEST PORT 1 VIEW  Result Date: 11/21/2019 CLINICAL DATA:  Fever EXAM: PORTABLE CHEST 1 VIEW COMPARISON:  2019-12-12 FINDINGS: Heart is normal size. No confluent opacities or effusions. Aortic atherosclerosis. NG tube is in the stomach. IMPRESSION: No acute cardiopulmonary disease. Electronically Signed   By: Charlett Nose M.D.   On: 11/21/2019 17:00   DG Chest Portable 1 View  Result Date: December 12, 2019 CLINICAL DATA:  Abdominal pain EXAM: PORTABLE CHEST 1 VIEW COMPARISON:  November 02, 2010 FINDINGS: The heart size and mediastinal contours are within normal limits. Aortic knob calcifications are seen. Stable calcifications seen along the right upper paratracheal stripe. Both lungs are clear. Again noted is advanced left shoulder osteoarthritis. There is diffuse osteopenia. IMPRESSION: No active disease. Electronically Signed   By: Jonna Clark M.D.   On:  12/12/19 17:39   DG Abd Portable 1V  Result Date: 12/01/2019 CLINICAL DATA:  Respiratory distress.  Abdominal tenderness. EXAM: PORTABLE ABDOMEN - 1 VIEW COMPARISON:  11/22/2019.  11/20/2019.  CT Dec 12, 2019. FINDINGS: Interim removal of NG tube. Previously identified oral contrast has passed. Interim improvement of small-bowel distention. Colonic gas pattern is normal. Stool noted throughout the colon. No free air. Severe degenerative changes lumbar spine. Degenerative changes both hips. IMPRESSION: Interim removal of NG tube. Previously  identified oral contrast has passed. Interim improvement of small-bowel distention. Stool noted in the colon. Electronically Signed   By: Maisie Fus  Register   On: 12/05/2019 08:49   DG Abd Portable 1V  Result Date: 11/22/2019 CLINICAL DATA:  Small bowel obstruction EXAM: PORTABLE ABDOMEN - 1 VIEW COMPARISON:  Two days ago FINDINGS: The enteric tube tip is at the proximal stomach. Dilated small bowel in the left abdomen, up to 4 cm in diameter. Oral contrast has diffused from prior. The number of affected loops is diminished. No concerning mass effect or gas collection IMPRESSION: Partial normalization of small bowel distension. Electronically Signed   By: Marnee Spring M.D.   On: 11/22/2019 05:37   DG Abd Portable 1V-Small Bowel Obstruction Protocol-initial, 8 hr delay  Result Date: 11/20/2019 CLINICAL DATA:  Small bowel obstruction. EXAM: PORTABLE ABDOMEN - 1 VIEW COMPARISON:  11/20/2019 at 10:39 a.m. FINDINGS: An enteric tube terminates in the gastric fundus. The stomach is moderately distended by oral contrast material, and there is a small to moderate-sized sliding hiatal hernia. Oral contrast is present in 2 loops of dilated proximal small bowel, and there is persistent mild dilatation of multiple gas-filled small bowel loops more distally in the central and left lower abdomen. Excreted contrast material is noted in the bladder. IMPRESSION: Persistent small  bowel dilatation consistent with obstruction. Oral contrast in the stomach and proximal small bowel. Electronically Signed   By: Sebastian Ache M.D.   On: 11/20/2019 21:14   DG Abd Portable 1V  Result Date: 11/20/2019 CLINICAL DATA:  Repositioned nasal/orogastric tube. EXAM: PORTABLE ABDOMEN - 1 VIEW COMPARISON:  11/20/2019 at 9:18 a.m. FINDINGS: Nasogastric tube has been further inserted, now extending well below the diaphragm to curl within the stomach. There are loops of mildly dilated small bowel in the central to left mid to lower abdomen. IMPRESSION: 1. Well-positioned nasal/orogastric tube. Electronically Signed   By: Amie Portland M.D.   On: 11/20/2019 11:18   DG Abd Portable 1V-Small Bowel Protocol-Position Verification  Result Date: 11/20/2019 CLINICAL DATA:  NG tube placement. EXAM: PORTABLE ABDOMEN - 1 VIEW COMPARISON:  11/20/2019 at 4:35 a.m. FINDINGS: Nasogastric tube has its tip just below the level of the carina, consistent with a mid esophageal position. IMPRESSION: 1. Nasal/orogastric tube tip projects in the midthoracic esophagus. This will need to be further inserted, 18-20 cm, to allow the tip to fully into the stomach. Electronically Signed   By: Amie Portland M.D.   On: 11/20/2019 11:17   DG Abd Portable 1V  Result Date: 11/20/2019 CLINICAL DATA:  Small-bowel obstruction EXAM: PORTABLE ABDOMEN - 1 VIEW COMPARISON:  None. FINDINGS: Mildly dilated loops of small bowel in the central abdomen. Excreted contrast in the urinary bladder. IMPRESSION: Mildly dilated loops of small bowel in the central abdomen. Electronically Signed   By: Deatra Robinson M.D.   On: 11/20/2019 05:03     Assessment and Plan:   Elevated Troponin  Likely NSTEMI - Patient admitted with small bowel obstruction. Found to be unresponsive, mildly hypotensive, and mildly tachycardic at the time. Also febrile with temp of 101.9. Troponin checked and came back 3,279.  - EKG shows sinus tachycardia with a lot of  baseline artifact but looks like ST depression in anterior leads. Will repeat EKG. - Echo pending - Patient unable to answer any questions at this time but does not sound like she has had any chest pain per sister. - Head CT being ordered to rule out bleed. If negative,  will start IV Heparin.  - Plan is to treat medically given advanced age. Aspirin already ordered. No beta-blocker given hypotension. Can restart statin when patient able to take PO safely. - Spoke with Dr. Jerral Ralph and sister will likely ransition to comfort care if patient's mental status does not improve.   Hypertension - History of hypertension but patient became mildly hypotensive this morning. Systolic BP in the 90's to low 100's. - Patient on Lisinopril at home but this was not started this admission. - Continue to monitor closely.  Hyperlipidemia - On Simvastatin at home.  - Can consider starting Lipitor/Crestor instead when able to take PO in favor of high-intensity statin given concern for MI. However, given patient's advanced age, I don't think we need to be overly aggressive in this.  Otherwise, per primary: - Small bowel obstruction - Altered mental status - Fever - AKI: suspect prerenal due to hypotension and poor PO intake with SBO - Hypokalemia: being replaced with IV potassium   The patient's TIMI risk score is 3, which indicates a 13% risk of all cause mortality, new or recurrent myocardial infarction or need for urgent revascularization in the next 14 days.    For questions or updates, please contact [facility] Please consult [email or web address] for contact info under    Signed, Corrin Parker, PA-C  Dec 14, 2019 12:54 PM  Patient seen and examined and agree with Marjie Skiff as detailed above.  In brief, patient is a 86 year old female with history of tobacco use with COPD/asthma, HTN and HLD who was initially admitted with abdominal pain found to have partial SBO. She was managed  conservatively and was placed on antibiotics for fevers. Today, the patient was noted to be unresponsive and staring at the ceiling. She was hypotensive, febrile and noted to have trop 3000. Cardiology has now been called for further evaluation.  CT head negative for acute pathology. CT chest with coronary calcification, large hiatal hernia, bilateral pleural effusions. Abdominal xray with interval improvement of SBO, no free air.   ECG with sinus rhythm and suggestion of inferolateral STD (poor baseline).   Difficult situation as patient is 86 years old and currently minimally responsive. Suspect true ACS, however, given mental status and elderly age; would not push for invasive measures at this time as risk likely outweighs the benefit. Discussed this with the patient's sister bedside and she agrees to pursue medical management and would like to avoid invasive procedures. She is also considering transitioning to palliative measures.   Exam: General: Elderly female, starring up at the ceiling. Not following commands CV: RR, no murmur Resp: Abdominal breathing, tachypneic, crackles at bases Extremities: No edema, warm Abd: Soft, distended, +BS Neuro: Not responding to questions or commands  Plan: -Recommend medical management of suspected ACS given neurologic status and age -Ongoing discussions with family regarding GOC -Okay with heparin given CT head negative -Follow-up TTE -Pending decision regarding GOC and ability to take PO, can start ASA 81mg , crestor 20mg  and low dose metoprolol for medical management of ACS  , MD

## 2019-12-06 NOTE — Progress Notes (Addendum)
[facility] Progress Note     Subjective: CC-  Rapid response called this morning. Patient was found minimally responsive, tachypneic, hypotensive. Work up in progress. No n/v. Was tolerating clear liquids yesterday prior to acute change in mental status. Unsure if passing flatus. Last BM yesterday morning.  Objective: Vital signs in last 24 hours: Temp:  [98 F (36.7 C)-101.9 F (38.8 C)] 100.2 F (37.9 C) (10/19 0951) Pulse Rate:  [91-105] 95 (10/19 0951) Resp:  [18-25] 25 (10/19 0951) BP: (96-128)/(39-73) 110/39 (10/19 0951) SpO2:  [92 %-95 %] 95 % (10/19 0951) Last BM Date: 11/21/19  Intake/Output from previous day: 10/18 0701 - 10/19 0700 In: 752.3 [P.O.:240; NG/GT:30; IV Piggyback:482.3] Out: 100 [Emesis/NG output:100] Intake/Output this shift: No intake/output data recorded.  PE: Gen:  Alert, minimally responsive HEENT: PERRL Card:  RRR, 2+ DP pulses Pulm:  Diminished breath sounds bilaterally, no wheezing or rhonchi, O2 sats 100% on 3L [place] Abd: Soft/ not rigid, mild distension, +BS, mild lower abdominal TTP without peritonitis Ext: calves soft and nontender Psych: will not answer orientation questions for me Neuro: moves all 4 extremities and follows simple commands  Lab Results:  Recent Labs    11/22/19 0515 November 26, 2019 0847  WBC 13.0* 4.7  HGB 9.5* 8.7*  HCT 29.0* 27.5*  PLT 239 249   BMET Recent Labs    11/22/19 0515 11/26/2019 0847  NA 142 140  K 3.6 3.2*  CL 109 109  CO2 21* 21*  GLUCOSE 108* 156*  BUN 43* 52*  CREATININE 1.18* 1.42*  CALCIUM 8.2* 7.9*   PT/INR No results for input(s): LABPROT, INR in the last 72 hours. CMP     Component Value Date/Time   NA 140 26-Nov-2019 0847   K 3.2 (L) 11-26-2019 0847   CL 109 26-Nov-2019 0847   CO2 21 (L) 11-26-19 0847   GLUCOSE 156 (H) 11/26/2019 0847   BUN 52 (H) 2019-11-26 0847   CREATININE 1.42 (H) 2019-11-26 0847   CALCIUM 7.9 (L) Nov 26, 2019 0847   PROT 5.2 (L) 2019/11/26 0847    ALBUMIN 2.4 (L) Nov 26, 2019 0847   AST 75 (H) 11-26-19 0847   ALT 34 11/26/19 0847   ALKPHOS 46 Nov 26, 2019 0847   BILITOT 1.3 (H) 11/26/2019 0847   GFRNONAA 32 (L) 2019-11-26 0847   GFRAA 50 (L) 10/21/2017 1040   Lipase     Component Value Date/Time   LIPASE 35 11/22/2019 1703       Studies/Results: DG Chest Port 1 View  Result Date: 2019/11/26 CLINICAL DATA:  Respiratory distress EXAM: PORTABLE CHEST 1 VIEW COMPARISON:  November 21, 2019 FINDINGS: Image rotated to the LEFT. Lung volumes are diminished since the previous exam. Blunting of LEFT costo diaphragmatic sulcus and partially obscured RIGHT hemidiaphragm. Patchy opacities in the chest, particularly RIGHT lower lobe and LEFT mid chest. Cardiomediastinal contours and hilar structures stable accounting for low lung volumes and rotated image. Extensive atherosclerotic CIS of the thoracic aorta and carotid arteries. Spinal degenerative changes and signs of prior LEFT humeral fracture. IMPRESSION: Diminished lung volumes with patchy opacities in the chest, particularly RIGHT lower lobe and LEFT mid chest. Findings could be related to multifocal pneumonia or atelectasis in the setting of developing effusions LEFT greater than RIGHT. Atherosclerosis. Aortic Atherosclerosis (ICD10-I70.0). Electronically Signed   By: Donzetta Kohut M.D.   On: 11/26/19 08:40   DG CHEST PORT 1 VIEW  Result Date: 11/21/2019 CLINICAL DATA:  Fever EXAM: PORTABLE CHEST 1 VIEW COMPARISON:  11/09/2019 FINDINGS: Heart  is normal size. No confluent opacities or effusions. Aortic atherosclerosis. NG tube is in the stomach. IMPRESSION: No acute cardiopulmonary disease. Electronically Signed   By: Charlett Nose M.D.   On: 11/21/2019 17:00   DG Abd Portable 1V  Result Date: 2019-12-02 CLINICAL DATA:  Respiratory distress.  Abdominal tenderness. EXAM: PORTABLE ABDOMEN - 1 VIEW COMPARISON:  11/22/2019.  11/20/2019.  CT 11/14/2019. FINDINGS: Interim removal of NG  tube. Previously identified oral contrast has passed. Interim improvement of small-bowel distention. Colonic gas pattern is normal. Stool noted throughout the colon. No free air. Severe degenerative changes lumbar spine. Degenerative changes both hips. IMPRESSION: Interim removal of NG tube. Previously identified oral contrast has passed. Interim improvement of small-bowel distention. Stool noted in the colon. Electronically Signed   By: Maisie Fus  Register   On: 12/02/2019 08:49   DG Abd Portable 1V  Result Date: 11/22/2019 CLINICAL DATA:  Small bowel obstruction EXAM: PORTABLE ABDOMEN - 1 VIEW COMPARISON:  Two days ago FINDINGS: The enteric tube tip is at the proximal stomach. Dilated small bowel in the left abdomen, up to 4 cm in diameter. Oral contrast has diffused from prior. The number of affected loops is diminished. No concerning mass effect or gas collection IMPRESSION: Partial normalization of small bowel distension. Electronically Signed   By: Marnee Spring M.D.   On: 11/22/2019 05:37    Anti-infectives: Anti-infectives (From admission, onward)   Start     Dose/Rate Route Frequency Ordered Stop   11/21/19 0900  metroNIDAZOLE (FLAGYL) IVPB 500 mg        500 mg 100 mL/hr over 60 Minutes Intravenous Every 8 hours 11/21/19 0820     11/20/19 0900  cefTRIAXone (ROCEPHIN) 2 g in sodium chloride 0.9 % 100 mL IVPB        2 g 200 mL/hr over 30 Minutes Intravenous Every 24 hours 11/20/19 0831         Assessment/Plan AKI on CKDIII HLD HTN Code status DNR  Partial SBO - Passed small bowel protocol, NG tube was removed and she was tolerating CLD and having bowel function prior to acute changes this AM. Abdominal xray today shows no free air, improved small bowel distension. Lactic acid is WNL. Low suspicion for SBO driving her fever and AMS. Will make her NPO due to AMS, but ok to advance as tolerated if/when she improves. May want to consider SLP swallow eval prior to advancing. Further  work up of fever/AMS per primary.  ID - rocephin/flagyl 10/16>> FEN - IVF, NPO due to mental status VTE - SCDs, ok for chemical DVT prophylaxis from surgical standpoint Foley - none Follow up - TBD   LOS: 4 days    Franne Forts, [facility] 02-Dec-2019, 10:12 AM Please see Amion for pager number during day hours 7:00am-4:30pm

## 2019-12-06 NOTE — Significant Event (Addendum)
Rapid Response Event Note   Reason for Call : Hypotension/AMS Notified by bedside night shift RN that patient was having a change in mental status. Patient was more lethargic. Upon arrival, patient was alert to self but disoriented x 3. Patient's blood pressure was initially in the 70's systolic. Ordered for 500 cc bolus to be given per rapid response protocol standing orders. Patient was also warm when palpated her forehead. Obtained rectal temp, which patient had a fever of 101.9 F.   Initial Focused Assessment:  Neuro: Alert to self, able to follow simple commands, move all extremities but weak. Temp: 101.9 F Cardiac: s1 and s2 heard upon auscultation, HR 100s, Hypotensive but fluid responsive Pulmonary: RR mid 20s, Rhonchi auscultated over left lung fields, clear and diminished on right, O2 Sats 95% on 4Liters [place], respiratory therapy decreased to 2L [place].   Interventions:  Ordered per rapid response protocol:  500 cc bolus Lactic Acid  Chest X-ray  Placed 20 G IV  MD Ghimire notified and came to bedside at 0743  -Ordered additional liter bolus of normal saline -PRN tylenol 625 mg rectal  -CBG Checked 78 at 0802, order of 1/2 amp of D50 Rechecked CBG at 0953-107  -EKG ordered   Plan of Care:  Transfer to SD for further management of hypotension    Event Summary:   MD Notified: MD Ghimire Call Time: Rapid Response called at 0711 Arrival Time: 0714 End Time: 0930   Henrene Hawking, RN

## 2019-12-06 NOTE — Progress Notes (Signed)
Noted rhythm changes and staff rushed to the patient's bedside and noted her to be  gasping for breath. The patient was surrounded by multiple staff members  and her sister when she took her last breath.Patient pronounce death by Ashlee Blackwater NP.

## 2019-12-06 NOTE — Death Summary Note (Signed)
Called to bedside to pronounce pt dead. Found pt with no response to verbal or tactile stimuli.Pupils fixed and dilated. No oculocephalic reflexes.Absent heart and lung sounds. Time of death 20:55. Sister Dennie Bible at bedside and emotional support given. Code status prior to death DNR-C.

## 2019-12-06 NOTE — Death Summary Note (Signed)
DEATH SUMMARY   Patient Details  Name: Ashlee Cobb MRN: [id number] DOB: [birth year]  Admission/Discharge Information   Admit Date:  11-29-2019  Date of Death: Date of Death: December 03, 2019  Time of Death: Time of Death: 05/30/53  Length of Stay: 4  Referring Physician: Gweneth Dimitri, MD   Reason(s) for Hospitalization  Small bowel obstruction   Diagnoses  Preliminary cause of death:  Secondary Diagnoses (including complications and co-morbidities):  Principal Problem:   Partial small bowel obstruction [facility]) Active Problems:   Hyperlipidemia   Hypertension   NSTEMI (non-ST elevated myocardial infarction) [facility])   Brief Hospital Course (including significant findings, care, treatment, and services provided and events leading to death)  Ashlee Cobb is a 86 y.o. year old female who has a history of hypertension hyperlipidemia, she was living in an assisted living facility and was brought to the hospital with abdominal pain and nausea vomiting.  She was initially admitted with the finding of small bowel obstruction and treated conservatively with NG tube, surgical consultation.  Patient did improve gradually from her small bowel obstruction.  During treatment and while recovering from bowel obstruction patient started becoming more somnolent, low oxygen levels.  On detailed investigations she was found to have small bilateral pleural effusions, dynamic EKG changes along with elevated troponins.  Since patient remained with poor mental status, a medical management was opted and she was treated symptomatically.  While on active treatment, patient died in the hospital on 12/03/19 at 05-30-53.  Patient had wishes for no CPR.  Her sister and other family were at the bedside.    Pertinent Labs and Studies  Significant Diagnostic Studies CT HEAD WO CONTRAST  Result Date: 12/03/19 CLINICAL DATA:  Mental status change. EXAM: CT HEAD WITHOUT CONTRAST TECHNIQUE: Contiguous axial images were obtained  from the base of the skull through the vertex without intravenous contrast. COMPARISON:  None. FINDINGS: Brain: Generalized atrophy without hydrocephalus. Mild white matter changes appear chronic. Negative for acute infarct, hemorrhage, mass. Vascular: Negative for hyperdense vessel Skull: Negative Sinuses/Orbits: Mild mucosal edema paranasal sinuses. Bilateral cataract extraction. Other: None IMPRESSION: Generalized atrophy.  No acute abnormality. Electronically Signed   By: Marlan Palau M.D.   On: 03-Dec-2019 13:24   CT CHEST WO CONTRAST  Result Date: 2019/12/03 CLINICAL DATA:  Chest pain, shortness of breath. EXAM: CT CHEST WITHOUT CONTRAST TECHNIQUE: Multidetector CT imaging of the chest was performed following the standard protocol without IV contrast. COMPARISON:  Radiograph of same day. FINDINGS: Cardiovascular: Atherosclerosis of thoracic aorta is noted without aneurysm formation. Mild cardiomegaly is noted. No pericardial effusion is noted. Coronary artery calcifications are noted. Mediastinum/Nodes: Thyroid gland is unremarkable. No adenopathy is noted. Large sliding-type hiatal hernia is noted. Moderate dilatation of the more proximal esophagus is noted which is fluid-filled. Lungs/Pleura: No pneumothorax is noted. Small bilateral pleural effusions are noted with adjacent subsegmental atelectasis of both lower lobes. Mild biapical scarring is noted. Upper Abdomen: No acute abnormality. Musculoskeletal: No chest wall mass or suspicious bone lesions identified. IMPRESSION: 1. Coronary artery calcifications are noted suggesting coronary artery disease. 2. Large sliding-type hiatal hernia is noted. Moderate dilatation of the more proximal esophagus is noted which is fluid-filled. 3. Small bilateral pleural effusions are noted with adjacent subsegmental atelectasis of both lower lobes. 4. Aortic atherosclerosis. Aortic Atherosclerosis (ICD10-I70.0). Electronically Signed   By: Lupita Raider M.D.   On:  12/03/19 10:34   CT ABDOMEN PELVIS W CONTRAST  Result Date: 2019-11-29 CLINICAL DATA:  Acute  lower abdominal pain none EXAM: CT ABDOMEN AND PELVIS WITH CONTRAST TECHNIQUE: Multidetector CT imaging of the abdomen and pelvis was performed using the standard protocol following bolus administration of intravenous contrast. CONTRAST:  100mL OMNIPAQUE IOHEXOL 300 MG/ML  SOLN COMPARISON:  None. FINDINGS: Lower chest: The visualized heart size within normal limits. No pericardial fluid/thickening. A small hiatal hernia is present with fluid reflux in the distal esophagus. The visualized portions of the lungs are clear. Hepatobiliary: The liver is normal in density without focal abnormality.The main portal vein is patent. The patient is status post cholecystectomy. Pancreas: Unremarkable. No pancreatic ductal dilatation or surrounding inflammatory changes. Spleen: Normal in size without focal abnormality. Adrenals/Urinary Tract: Both adrenal glands appear normal. Multiple bilateral low-density lesions are seen throughout kidneys likely renal cysts the largest in lower pole the right kidney measures 5.2 cm. There is mild atrophy. There is a punctate calcification in the pole the right kidney. Probable bilateral renal vascular calcifications are. The bladder is. Stomach/Bowel: As above there is a small hiatal hernia present. The small is unremarkable. Within the mid ileal loops in the left lower quadrant there is mildly dilated air and fluid-filled measuring up to 3 cm significant surrounding mesenteric edema and amount loculated fluid. There is a focal area narrowing seen within the left mid abdomen best seen series 2, image 49. There remainder the distal loops decompressed. There is a moderate amount stool present throughout. Vascular/Lymphatic: There are no enlarged mesenteric, retroperitoneal, or pelvic lymph nodes. Scattered aortic atherosclerotic calcifications are seen without aneurysmal dilatation. Reproductive:  The patient is status post hysterectomy. No adnexal masses or collections seen. Other: A small amount of abdominopelvic ascites is seen. No abdominal wall hernia is noted. Musculoskeletal: No acute or significant osseous findings. A healed fracture deformity seen within the inferior left pubic rami. IMPRESSION: Findings consistent with a partial small bowel obstruction within the left mid abdomen within the mid ileal loops with a focal area of narrowing as described. Small amount abdominopelvic ascites Hiatal hernia with gastroesophageal reflux Aortic Atherosclerosis (ICD10-I70.0). Electronically Signed   By: Jonna ClarkBindu  Avutu M.D.   On: 2019/10/13 19:05   DG Chest Port 1 View  Result Date: 11/16/2019 CLINICAL DATA:  Respiratory distress EXAM: PORTABLE CHEST 1 VIEW COMPARISON:  November 21, 2019 FINDINGS: Image rotated to the LEFT. Lung volumes are diminished since the previous exam. Blunting of LEFT costo diaphragmatic sulcus and partially obscured RIGHT hemidiaphragm. Patchy opacities in the chest, particularly RIGHT lower lobe and LEFT mid chest. Cardiomediastinal contours and hilar structures stable accounting for low lung volumes and rotated image. Extensive atherosclerotic CIS of the thoracic aorta and carotid arteries. Spinal degenerative changes and signs of prior LEFT humeral fracture. IMPRESSION: Diminished lung volumes with patchy opacities in the chest, particularly RIGHT lower lobe and LEFT mid chest. Findings could be related to multifocal pneumonia or atelectasis in the setting of developing effusions LEFT greater than RIGHT. Atherosclerosis. Aortic Atherosclerosis (ICD10-I70.0). Electronically Signed   By: Donzetta KohutGeoffrey  Wile M.D.   On: 11/10/2019 08:40   DG CHEST PORT 1 VIEW  Result Date: 11/21/2019 CLINICAL DATA:  Fever EXAM: PORTABLE CHEST 1 VIEW COMPARISON:  2019/10/13 FINDINGS: Heart is normal size. No confluent opacities or effusions. Aortic atherosclerosis. NG tube is in the stomach.  IMPRESSION: No acute cardiopulmonary disease. Electronically Signed   By: Charlett NoseKevin  Dover M.D.   On: 11/21/2019 17:00   DG Chest Portable 1 View  Result Date: 2019/10/13 CLINICAL DATA:  Abdominal pain EXAM: PORTABLE CHEST 1 VIEW  COMPARISON:  November 02, 2010 FINDINGS: The heart size and mediastinal contours are within normal limits. Aortic knob calcifications are seen. Stable calcifications seen along the right upper paratracheal stripe. Both lungs are clear. Again noted is advanced left shoulder osteoarthritis. There is diffuse osteopenia. IMPRESSION: No active disease. Electronically Signed   By: Jonna Clark M.D.   On: 11/10/2019 17:39   DG Abd Portable 1V  Result Date: 03-Dec-2019 CLINICAL DATA:  Respiratory distress.  Abdominal tenderness. EXAM: PORTABLE ABDOMEN - 1 VIEW COMPARISON:  11/22/2019.  11/20/2019.  CT 11/28/2019. FINDINGS: Interim removal of NG tube. Previously identified oral contrast has passed. Interim improvement of small-bowel distention. Colonic gas pattern is normal. Stool noted throughout the colon. No free air. Severe degenerative changes lumbar spine. Degenerative changes both hips. IMPRESSION: Interim removal of NG tube. Previously identified oral contrast has passed. Interim improvement of small-bowel distention. Stool noted in the colon. Electronically Signed   By: Maisie Fus  Register   On: 12/03/2019 08:49   DG Abd Portable 1V  Result Date: 11/22/2019 CLINICAL DATA:  Small bowel obstruction EXAM: PORTABLE ABDOMEN - 1 VIEW COMPARISON:  Two days ago FINDINGS: The enteric tube tip is at the proximal stomach. Dilated small bowel in the left abdomen, up to 4 cm in diameter. Oral contrast has diffused from prior. The number of affected loops is diminished. No concerning mass effect or gas collection IMPRESSION: Partial normalization of small bowel distension. Electronically Signed   By: Marnee Spring M.D.   On: 11/22/2019 05:37   DG Abd Portable 1V-Small Bowel Obstruction  Protocol-initial, 8 hr delay  Result Date: 11/20/2019 CLINICAL DATA:  Small bowel obstruction. EXAM: PORTABLE ABDOMEN - 1 VIEW COMPARISON:  11/20/2019 at 10:39 a.m. FINDINGS: An enteric tube terminates in the gastric fundus. The stomach is moderately distended by oral contrast material, and there is a small to moderate-sized sliding hiatal hernia. Oral contrast is present in 2 loops of dilated proximal small bowel, and there is persistent mild dilatation of multiple gas-filled small bowel loops more distally in the central and left lower abdomen. Excreted contrast material is noted in the bladder. IMPRESSION: Persistent small bowel dilatation consistent with obstruction. Oral contrast in the stomach and proximal small bowel. Electronically Signed   By: Sebastian Ache M.D.   On: 11/20/2019 21:14   DG Abd Portable 1V  Result Date: 11/20/2019 CLINICAL DATA:  Repositioned nasal/orogastric tube. EXAM: PORTABLE ABDOMEN - 1 VIEW COMPARISON:  11/20/2019 at 9:18 a.m. FINDINGS: Nasogastric tube has been further inserted, now extending well below the diaphragm to curl within the stomach. There are loops of mildly dilated small bowel in the central to left mid to lower abdomen. IMPRESSION: 1. Well-positioned nasal/orogastric tube. Electronically Signed   By: Amie Portland M.D.   On: 11/20/2019 11:18   DG Abd Portable 1V-Small Bowel Protocol-Position Verification  Result Date: 11/20/2019 CLINICAL DATA:  NG tube placement. EXAM: PORTABLE ABDOMEN - 1 VIEW COMPARISON:  11/20/2019 at 4:35 a.m. FINDINGS: Nasogastric tube has its tip just below the level of the carina, consistent with a mid esophageal position. IMPRESSION: 1. Nasal/orogastric tube tip projects in the midthoracic esophagus. This will need to be further inserted, 18-20 cm, to allow the tip to fully into the stomach. Electronically Signed   By: Amie Portland M.D.   On: 11/20/2019 11:17   DG Abd Portable 1V  Result Date: 11/20/2019 CLINICAL DATA:   Small-bowel obstruction EXAM: PORTABLE ABDOMEN - 1 VIEW COMPARISON:  None. FINDINGS: Mildly dilated loops of small  bowel in the central abdomen. Excreted contrast in the urinary bladder. IMPRESSION: Mildly dilated loops of small bowel in the central abdomen. Electronically Signed   By: Deatra Robinson M.D.   On: 11/20/2019 05:03   ECHOCARDIOGRAM COMPLETE  Result Date: 11/09/2019    ECHOCARDIOGRAM REPORT   Patient Name:   DORIA FERN Date of Exam: 11/24/2019 Medical Rec #:  [id number]     Height:       63.0 in Accession #:    [id number]    Weight:       153.9 lb Date of Birth:  1923-08-14      BSA:          1.730 m Patient Age:    89 years      BP:           88/34 mmHg Patient Gender: F             HR:           75 bpm. Exam Location:  Inpatient Procedure: 2D Echo, Cardiac Doppler and Color Doppler Indications:    R94.31 Abnormal EKG  History:        Patient has prior history of Echocardiogram examinations, most                 recent 07/18/2014. COPD; Risk Factors:Hypertension, Dyslipidemia                 and Current Smoker. Elevated troponin.  Sonographer:    Sheralyn Boatman RDCS Referring Phys: [id number] [facility]  Sonographer Comments: Technically difficult study due to poor echo windows. IMPRESSIONS  1. Left ventricular ejection fraction, by estimation, is 60 to 65%. The left ventricle has normal function. The left ventricle has no regional wall motion abnormalities. There is moderate concentric left ventricular hypertrophy. Left ventricular diastolic parameters are consistent with Grade I diastolic dysfunction (impaired relaxation).  2. Right ventricular systolic function is normal. The right ventricular size is moderately enlarged. There is normal pulmonary artery systolic pressure.  3. Large pleural effusion in both left and right lateral regions.  4. The mitral valve is grossly normal. Mild mitral valve regurgitation.  5. The aortic valve is tricuspid. There is mild calcification of the aortic valve. Aortic  valve regurgitation is not visualized. Comparison(s): A prior study was performed on 07/17/2017. Unable to open images; similar from report. RV may have increase in size. Billateral pleural effusions are prominent throughout study. FINDINGS  Left Ventricle: Left ventricular ejection fraction, by estimation, is 60 to 65%. The left ventricle has normal function. The left ventricle has no regional wall motion abnormalities. The left ventricular internal cavity size was small. There is moderate  concentric left ventricular hypertrophy. Left ventricular diastolic parameters are consistent with Grade I diastolic dysfunction (impaired relaxation). Right Ventricle: The right ventricular size is moderately enlarged. No increase in right ventricular wall thickness. Right ventricular systolic function is normal. There is normal pulmonary artery systolic pressure. The tricuspid regurgitant velocity is 2.42 m/s, and with an assumed right atrial pressure of 3 mmHg, the estimated right ventricular systolic pressure is 26.4 mmHg. Left Atrium: Left atrial size was normal in size. Right Atrium: Right atrial size was not well visualized. Pericardium: There is no evidence of pericardial effusion. Mitral Valve: The mitral valve is grossly normal. Mild mitral valve regurgitation. Tricuspid Valve: The tricuspid valve is grossly normal. Tricuspid valve regurgitation is mild. Aortic Valve: The aortic valve is tricuspid. There is mild calcification of the aortic valve. Aortic  valve regurgitation is not visualized. Pulmonic Valve: The pulmonic valve was grossly normal. Pulmonic valve regurgitation is not visualized. Aorta: The aortic root is normal in size and structure and the ascending aorta was not well visualized. IAS/Shunts: The atrial septum is grossly normal. Additional Comments: There is a large pleural effusion in both left and right lateral regions.  LEFT VENTRICLE PLAX 2D LVIDd:         3.50 cm     Diastology LVIDs:         2.40 cm      LV e' medial:    5.66 cm/s LV PW:         1.30 cm     LV E/e' medial:  12.9 LV IVS:        1.30 cm     LV e' lateral:   4.68 cm/s LVOT diam:     1.80 cm     LV E/e' lateral: 15.6 LV SV:         69 LV SV Index:   40 LVOT Area:     2.54 cm  LV Volumes (MOD) LV vol d, MOD A2C: 55.2 ml LV vol d, MOD A4C: 32.1 ml LV vol s, MOD A2C: 12.6 ml LV vol s, MOD A4C: 10.5 ml LV SV MOD A2C:     42.6 ml LV SV MOD A4C:     32.1 ml LV SV MOD BP:      29.8 ml RIGHT VENTRICLE            IVC RV S prime:     8.59 cm/s  IVC diam: 2.10 cm TAPSE (M-mode): 1.9 cm LEFT ATRIUM             Index       RIGHT ATRIUM           Index LA diam:        2.40 cm 1.39 cm/m  RA Area:     13.20 cm LA Vol (A2C):   16.8 ml 9.71 ml/m  RA Volume:   34.60 ml  20.00 ml/m LA Vol (A4C):   46.5 ml 26.88 ml/m LA Biplane Vol: 27.8 ml 16.07 ml/m  AORTIC VALVE LVOT Vmax:   114.00 cm/s LVOT Vmean:  79.000 cm/s LVOT VTI:    0.272 m  AORTA Ao Root diam: 3.20 cm Ao Asc diam:  3.60 cm MITRAL VALVE               TRICUSPID VALVE MV Area (PHT): 2.73 cm    TR Peak grad:   23.4 mmHg MV Decel Time: 278 msec    TR Vmax:        242.00 cm/s MV E velocity: 73.10 cm/s MV A velocity: 99.40 cm/s  SHUNTS MV E/A ratio:  0.74        Systemic VTI:  0.27 m                            Systemic Diam: 1.80 cm Riley Lam MD Electronically signed by Riley Lam MD Signature Date/Time: 16-Dec-2019/5:47:37 PM    Final     Microbiology Recent Results (from the past 240 hour(s))  Respiratory Panel by RT PCR (Flu A&B, Covid) - Nasopharyngeal Swab     Status: None   Collection Time: 12/03/2019  5:22 PM   Specimen: Nasopharyngeal Swab  Result Value Ref Range Status   SARS Coronavirus 2 by RT PCR NEGATIVE NEGATIVE Final  Comment: (NOTE) SARS-CoV-2 target nucleic acids are NOT DETECTED.  The SARS-CoV-2 RNA is generally detectable in upper respiratoy specimens during the acute phase of infection. The lowest concentration of SARS-CoV-2 viral copies this assay can  detect is 131 copies/mL. A negative result does not preclude SARS-Cov-2 infection and should not be used as the sole basis for treatment or other patient management decisions. A negative result may occur with  improper specimen collection/handling, submission of specimen other than nasopharyngeal swab, presence of viral mutation(s) within the areas targeted by this assay, and inadequate number of viral copies (<131 copies/mL). A negative result must be combined with clinical observations, patient history, and epidemiological information. The expected result is Negative.  Fact Sheet for Patients:  https://www.moore.com/  Fact Sheet for Healthcare Providers:  https://www.young.biz/  This test is no t yet approved or cleared by the [place] FDA and  has been authorized for detection and/or diagnosis of SARS-CoV-2 by FDA under an Emergency Use Authorization (EUA). This EUA will remain  in effect (meaning this test can be used) for the duration of the COVID-19 declaration under Section 564(b)(1) of the Act, 21 U.S.C. section 360bbb-3(b)(1), unless the authorization is terminated or revoked sooner.     Influenza A by PCR NEGATIVE NEGATIVE Final   Influenza B by PCR NEGATIVE NEGATIVE Final    Comment: (NOTE) The Xpert Xpress SARS-CoV-2/FLU/RSV assay is intended as an aid in  the diagnosis of influenza from Nasopharyngeal swab specimens and  should not be used as a sole basis for treatment. Nasal washings and  aspirates are unacceptable for Xpert Xpress SARS-CoV-2/FLU/RSV  testing.  Fact Sheet for Patients: https://www.moore.com/  Fact Sheet for Healthcare Providers: https://www.young.biz/  This test is not yet approved or cleared by the [place] FDA and  has been authorized for detection and/or diagnosis of SARS-CoV-2 by  FDA under an Emergency Use Authorization (EUA). This EUA will remain  in  effect (meaning this test can be used) for the duration of the  Covid-19 declaration under Section 564(b)(1) of the Act, 21  U.S.C. section 360bbb-3(b)(1), unless the authorization is  terminated or revoked. Performed at [facility], [address], [place], [place] [address]   Culture, blood (routine x 2)     Status: None (Preliminary result)   Collection Time: 11/21/19  8:45 AM   Specimen: BLOOD RIGHT HAND  Result Value Ref Range Status   Specimen Description   Final    BLOOD RIGHT HAND Performed at [facility], [address], [place], [place] [address]    Special Requests   Final    BOTTLES DRAWN AEROBIC ONLY Blood Culture adequate volume Performed at [facility], [address], [place], [place] [address]    Culture   Final    NO GROWTH 3 DAYS Performed at [facility], [address], [place], [place] [address]    Report Status PENDING  Incomplete  Culture, blood (routine x 2)     Status: None (Preliminary result)   Collection Time: 11/21/19  8:45 AM   Specimen: BLOOD RIGHT HAND  Result Value Ref Range Status   Specimen Description   Final    BLOOD RIGHT HAND Performed at [facility], [address] [place], [place] [address]    Special Requests   Final    BOTTLES DRAWN AEROBIC ONLY Blood Culture adequate volume Performed at [facility], [address] [place], [place] [address]  Culture   Final    NO GROWTH 3 DAYS Performed at [facility], [address] [place], [place] [address]    Report Status PENDING  Incomplete  Culture, blood (routine x 2)     Status: None (Preliminary result)   Collection Time: 11-24-19  8:46 AM   Specimen: Left Antecubital; Blood  Result Value Ref Range Status   Specimen Description   Final    LEFT ANTECUBITAL Performed at [facility], [address], [place], [place] [address]    Special Requests   Final    BOTTLES DRAWN  AEROBIC AND ANAEROBIC Blood Culture adequate volume Performed at [facility], [address] [place], [place] [address]    Culture   Final    NO GROWTH < 24 HOURS Performed at [facility], [address], [place], [place] [address]    Report Status PENDING  Incomplete  Culture, blood (routine x 2)     Status: None (Preliminary result)   Collection Time: 11-24-2019  8:49 AM   Specimen: BLOOD RIGHT ARM  Result Value Ref Range Status   Specimen Description   Final    BLOOD RIGHT ARM Performed at [facility], [address] [place], [place] [address]    Special Requests   Final    BOTTLES DRAWN AEROBIC ONLY Blood Culture adequate volume Performed at [facility], [address] [place], [place] [address]    Culture   Final    NO GROWTH < 24 HOURS Performed at [facility], [address] [place], [place] [address]    Report Status PENDING  Incomplete  MRSA PCR Screening     Status: Abnormal   Collection Time: 24-Nov-2019  9:53 AM   Specimen: Nasal Mucosa; Nasopharyngeal  Result Value Ref Range Status   MRSA by PCR POSITIVE (A) NEGATIVE Final    Comment:        The GeneXpert MRSA Assay (FDA approved for NASAL specimens only), is one component of a comprehensive MRSA colonization surveillance program. It is not intended to diagnose MRSA infection nor to guide or monitor treatment for MRSA infections. RESULT CALLED TO, READ BACK BY AND VERIFIED WITH: D.HENRY AT 1348 ON 11-24-2019 BY N.THOMPSON Performed at [facility], [address] [place], [place] [address]     Lab Basic Metabolic Panel: Recent Labs  Lab 11/18/2019 1703 11/18/2019 1703 11/09/2019 1712 11/20/19 0938 11/21/19 0557 11/22/19 0515 11/24/2019 0742 Nov 24, 2019 0847  NA 140   < > 138 139 141 142  --  140  K 4.7   < > 5.1 4.8 4.1 3.6  --  3.2*  CL 103   < > 104 106 109 109  --  109  CO2 24  --   --  18* 23 21*  --  21*   GLUCOSE 123*   < > 119* 126* 121* 108*  --  156*  BUN 30*   < > 36* 26* 41* 43*  --  52*  CREATININE 1.11*   < > 1.10* 1.13* 1.29* 1.18*  --  1.42*  CALCIUM 9.4  --   --  8.5* 8.2* 8.2*  --  7.9*  MG  --   --   --   --  2.1  --  2.2  --   PHOS  --   --   --   --   --   --  2.8  --    < > =  values in this interval not displayed.   Liver Function Tests: Recent Labs  Lab 22-Nov-2019 1703 11/15/2019 0847  AST 26 75*  ALT 14 34  ALKPHOS 63 46  BILITOT 0.7 1.3*  PROT 7.4 5.2*  ALBUMIN 4.2 2.4*   Recent Labs  Lab November 22, 2019 1703  LIPASE 35   No results for input(s): AMMONIA in the last 168 hours. CBC: Recent Labs  Lab 11-22-19 1703 Nov 22, 2019 1703 2019-11-22 1712 11/20/19 0648 11/21/19 0557 11/22/19 0515 11/21/2019 0847  WBC 11.0*  --   --  13.6* 13.4* 13.0* 4.7  NEUTROABS 9.0*  --   --   --  10.9*  --  4.2  HGB 11.4*   < > 12.2 10.4* 9.5* 9.5* 8.7*  HCT 36.6   < > 36.0 32.9* 30.0* 29.0* 27.5*  MCV 95.8  --   --  95.1 94.9 92.7 93.2  PLT 316  --   --  275 235 239 249   < > = values in this interval not displayed.   Cardiac Enzymes: No results for input(s): CKTOTAL, CKMB, CKMBINDEX, TROPONINI in the last 168 hours. Sepsis Labs: Recent Labs  Lab 2019-11-22 1703 Nov 22, 2019 1703 11/20/19 0981 11/21/19 0557 11/22/19 0515 11/28/2019 0743 11/21/2019 0846 11/18/2019 0847  WBC 11.0*   < > 13.6* 13.4* 13.0*  --   --  4.7  LATICACIDVEN 1.5  --   --  1.3  --  1.6 1.4  --    < > = values in this interval not displayed.    Procedures/Operations     Rande Roylance 11/24/2019, 5:00 PM

## 2019-12-06 NOTE — CV Procedure (Signed)
2D echo attempted, but patient going to CT per RN. Will try later

## 2019-12-06 NOTE — Progress Notes (Signed)
During shift change, pt was unresponsive and staring at the ceiling. Pt was not her baseline orientation. Pt was belly breathing rapidly and not answering to her name. Vitals were taken. RRT was called. RRT ordered a 500 mL bolus. After bolus, pt responded to her name and could squeeze her hands. Report was given to oncoming RN, MD, and RRT.

## 2019-12-06 NOTE — Progress Notes (Signed)
PROGRESS NOTE    VERNON ARIEL  WFU:[id number] DOB: February 03, 1924 DOA: 11/22/2019 PCP: Cari Caraway, MD    Brief Narrative:  86 year old female with history of hypertension and hyperlipidemia from [facility] assisted living facility admitted with abdominal pain and found to have partial small bowel obstruction.  She is treated conservatively with NG tube suction and followed by surgery.    Small bowel obstruction improved, however patient had a significant event complicating hospitalization. 10/17, reported low-grade temperature and started on antibiotics.  10/19. A rapid response called early morning for altered mentation, low blood pressure and lethargy.  Found with non-STEMI.  See below.   Assessment & Plan:   Principal Problem:   Partial small bowel obstruction (HCC) Active Problems:   Hyperlipidemia   Hypertension  Partial small bowel obstruction: Radiologically and clinically resolved.  She tolerated clears until 10/18, now n.p.o. secondary to altered mental status.  Will keep n.p.o. until patient's mental status improves.  Followed by surgery.  SIRS with fever, hypotension: 10/19, found with low blood pressure systolic 70 and altered mentation.  Normal lactic acid. Chest x-ray with bilateral small pleural effusion, CT scan of the chest with no evidence of consolidation.  Probable atelectasis. Urine culture, no growth from 10/17 Blood cultures drawn Unknown source of infection, likely atelectasis. Continue Rocephin and Flagyl that was started on 10/17 Patient unable to participate on chest physiotherapy, will start once patient is able to participate.  Acute metabolic encephalopathy:  After initial improvement from small bowel obstruction, patient developed altered mental status and lethargy. Rapid response 10/19 Blood pressure systolic 70, patient nonfocal but very lethargic and dry.  On 2 L oxygen.  Maintaining airway. Resuscitated with 1.5 L of fluid with improvement of  blood pressure, lactic acid is normal. Cause unknown.  Infectious versus non-STEMI. We will check CT head today.  Non-ST elevation MI: troponins 3200-3700 EKG with ST depression and PVCs Patient unable to explain about chest pain No history of coronary artery disease Discussed with cardiology We will start on rectal aspirin. Statin on hold.  Patient n.p.o. Echocardiogram today. Patient is with altered mental status, unable to undergo cardiac cath today because of medical instability May treat conservatively, may treated with heparin infusion, will rule out any intracranial process or bleeding with CT head. Discussed with patient's sister who is also healthcare power of attorney at bedside, patient has desired DNR and if further deterioration, desires comfort care measures.  Hypokalemia: Replace with IV potassium.  Pain is remains adequate.  Hypertension: ACE inhibitor on hold.  As needed blood pressure medications.  However blood pressure is low.  Acute kidney injury: Renal functions fluctuate, however at about her baseline.  Social/ethics/family discussion: Met with patient's sister at the bedside.  We discussed about possible ongoing heart attack, possible infection, altered mentation in a patient with very advanced age and debility. Decided that we will treat any treatable with reasonable medical regimen. She is already DNR/DNI, no CPR or intubation. We will monitor for next several hours if any evidence of improvement will continue treatment. If she does not show any improvement or worsening clinical status, will keep her comfortable with medications and supportive measures.    DVT prophylaxis: SCDs Start: 11/10/2019 2038   Code Status: DNR/DNI Family Communication: Patient's sister at the bedside Disposition Plan: Status is: Inpatient  Remains inpatient appropriate because:IV treatments appropriate due to intensity of illness or inability to take PO and Inpatient level of  care appropriate due to severity of illness  Dispo: The patient is from: ALF              Anticipated d/c is to: SNF, critical.  Transfer to stepdown unit today.              Anticipated d/c date is: 3 days              Patient currently is not medically stable to d/c.         Consultants:   General surgery  Cardiology  Procedures:   None  Antimicrobials:  Anti-infectives (From admission, onward)   Start     Dose/Rate Route Frequency Ordered Stop   11/21/19 0900  metroNIDAZOLE (FLAGYL) IVPB 500 mg        500 mg 100 mL/hr over 60 Minutes Intravenous Every 8 hours 11/21/19 0820     11/20/19 0900  cefTRIAXone (ROCEPHIN) 2 g in sodium chloride 0.9 % 100 mL IVPB        2 g 200 mL/hr over 30 Minutes Intravenous Every 24 hours 11/20/19 0831           Subjective: Patient seen and examined multiple times today.  See rapid response note above. Seen in follow-up.  She remains lethargic.  She follows simple commands but has hard time to keep her eyes open. She denies any chest pain, however not reliable.   Objective: Vitals:   Dec 03, 2019 0758 12/03/2019 0900 03-Dec-2019 0951 03-Dec-2019 1100  BP:  (!) 102/48 (!) 110/39 (!) 94/35  Pulse:   95 95  Resp:   (!) 25 (!) 26  Temp:   100.2 F (37.9 C)   TempSrc:   Rectal   SpO2: 95%  95% 97%  Weight:      Height:        Intake/Output Summary (Last 24 hours) at 12-03-19 1246 Last data filed at December 03, 2019 0600 Gross per 24 hour  Intake 402.26 ml  Output 100 ml  Net 302.26 ml   Filed Weights   11/22/2019 2120  Weight: 69.8 kg    Examination:  General exam: Appears sick, lethargic and profoundly weak.  Mucous membranes are dry. Respiratory system: Clear to auscultation. Respiratory effort normal.  No added sounds. Cardiovascular system: S1 & S2 heard, RRR. No JVD, murmurs, rubs, gallops or clicks. No pedal edema. Gastrointestinal system: Abdomen is soft, generalized diffuse tenderness on deep palpation.  Bowel sounds are  present. Central nervous system: Sleepy, lethargic.  Difficult to elicit orientation. Extremities: Symmetric 5 x 5 power.  Moves all extremities but very weak.   Data Reviewed: I have personally reviewed following labs and imaging studies  CBC: Recent Labs  Lab 11/30/2019 1703 11/07/2019 1703 11/13/2019 1712 11/20/19 0648 11/21/19 0557 11/22/19 0515 2019-12-03 0847  WBC 11.0*  --   --  13.6* 13.4* 13.0* 4.7  NEUTROABS 9.0*  --   --   --  10.9*  --  4.2  HGB 11.4*   < > 12.2 10.4* 9.5* 9.5* 8.7*  HCT 36.6   < > 36.0 32.9* 30.0* 29.0* 27.5*  MCV 95.8  --   --  95.1 94.9 92.7 93.2  PLT 316  --   --  275 235 239 249   < > = values in this interval not displayed.   Basic Metabolic Panel: Recent Labs  Lab 11/29/2019 1703 11/21/2019 1703 11/22/2019 1712 11/20/19 1194 11/21/19 0557 11/22/19 0515 2019/12/03 0742 2019-12-03 0847  NA 140   < > 138 139 141 142  --  140  K  4.7   < > 5.1 4.8 4.1 3.6  --  3.2*  CL 103   < > 104 106 109 109  --  109  CO2 24  --   --  18* 23 21*  --  21*  GLUCOSE 123*   < > 119* 126* 121* 108*  --  156*  BUN 30*   < > 36* 26* 41* 43*  --  52*  CREATININE 1.11*   < > 1.10* 1.13* 1.29* 1.18*  --  1.42*  CALCIUM 9.4  --   --  8.5* 8.2* 8.2*  --  7.9*  MG  --   --   --   --  2.1  --  2.2  --   PHOS  --   --   --   --   --   --  2.8  --    < > = values in this interval not displayed.   GFR: Estimated Creatinine Clearance: 22.7 mL/min (A) (by C-G formula based on SCr of 1.42 mg/dL (H)). Liver Function Tests: Recent Labs  Lab 11/09/2019 1703 12/17/2019 0847  AST 26 75*  ALT 14 34  ALKPHOS 63 46  BILITOT 0.7 1.3*  PROT 7.4 5.2*  ALBUMIN 4.2 2.4*   Recent Labs  Lab 11/16/2019 1703  LIPASE 35   No results for input(s): AMMONIA in the last 168 hours. Coagulation Profile: No results for input(s): INR, PROTIME in the last 168 hours. Cardiac Enzymes: No results for input(s): CKTOTAL, CKMB, CKMBINDEX, TROPONINI in the last 168 hours. BNP (last 3 results) No results  for input(s): PROBNP in the last 8760 hours. HbA1C: No results for input(s): HGBA1C in the last 72 hours. CBG: Recent Labs  Lab 17-Dec-2019 0802 12/17/19 0953  GLUCAP 78 107*   Lipid Profile: No results for input(s): CHOL, HDL, LDLCALC, TRIG, CHOLHDL, LDLDIRECT in the last 72 hours. Thyroid Function Tests: No results for input(s): TSH, T4TOTAL, FREET4, T3FREE, THYROIDAB in the last 72 hours. Anemia Panel: No results for input(s): VITAMINB12, FOLATE, FERRITIN, TIBC, IRON, RETICCTPCT in the last 72 hours. Sepsis Labs: Recent Labs  Lab 12/01/2019 1703 11/21/19 0557 17-Dec-2019 0743 12-17-2019 0846  LATICACIDVEN 1.5 1.3 1.6 1.4    Recent Results (from the past 240 hour(s))  Respiratory Panel by RT PCR (Flu A&B, Covid) - Nasopharyngeal Swab     Status: None   Collection Time: 11/29/2019  5:22 PM   Specimen: Nasopharyngeal Swab  Result Value Ref Range Status   SARS Coronavirus 2 by RT PCR NEGATIVE NEGATIVE Final    Comment: (NOTE) SARS-CoV-2 target nucleic acids are NOT DETECTED.  The SARS-CoV-2 RNA is generally detectable in upper respiratoy specimens during the acute phase of infection. The lowest concentration of SARS-CoV-2 viral copies this assay can detect is 131 copies/mL. A negative result does not preclude SARS-Cov-2 infection and should not be used as the sole basis for treatment or other patient management decisions. A negative result may occur with  improper specimen collection/handling, submission of specimen other than nasopharyngeal swab, presence of viral mutation(s) within the areas targeted by this assay, and inadequate number of viral copies (<131 copies/mL). A negative result must be combined with clinical observations, patient history, and epidemiological information. The expected result is Negative.  Fact Sheet for Patients:  PinkCheek.be  Fact Sheet for Healthcare Providers:  GravelBags.it  This test is  no t yet approved or cleared by the [place] FDA and  has been authorized for detection and/or diagnosis of  SARS-CoV-2 by FDA under an Emergency Use Authorization (EUA). This EUA will remain  in effect (meaning this test can be used) for the duration of the COVID-19 declaration under Section 564(b)(1) of the Act, 21 U.S.C. section 360bbb-3(b)(1), unless the authorization is terminated or revoked sooner.     Influenza A by PCR NEGATIVE NEGATIVE Final   Influenza B by PCR NEGATIVE NEGATIVE Final    Comment: (NOTE) The Xpert Xpress SARS-CoV-2/FLU/RSV assay is intended as an aid in  the diagnosis of influenza from Nasopharyngeal swab specimens and  should not be used as a sole basis for treatment. Nasal washings and  aspirates are unacceptable for Xpert Xpress SARS-CoV-2/FLU/RSV  testing.  Fact Sheet for Patients: PinkCheek.be  Fact Sheet for Healthcare Providers: GravelBags.it  This test is not yet approved or cleared by the [place] FDA and  has been authorized for detection and/or diagnosis of SARS-CoV-2 by  FDA under an Emergency Use Authorization (EUA). This EUA will remain  in effect (meaning this test can be used) for the duration of the  Covid-19 declaration under Section 564(b)(1) of the Act, 21  U.S.C. section 360bbb-3(b)(1), unless the authorization is  terminated or revoked. Performed at [facility], [place] [address] [place], [place] [address]   Culture, blood (routine x 2)     Status: None (Preliminary result)   Collection Time: 11/21/19  8:45 AM   Specimen: BLOOD RIGHT HAND  Result Value Ref Range Status   Specimen Description   Final    BLOOD RIGHT HAND Performed at [facility], [place] [address], [place], [place] [address]    Special Requests   Final    BOTTLES DRAWN AEROBIC ONLY Blood Culture adequate volume Performed at [place] [address], [place], [place] [address]    Culture   Final    NO GROWTH 2 DAYS Performed at [place] [address], [place], [place] [address]    Report Status PENDING  Incomplete  Culture, blood (routine x 2)     Status: None (Preliminary result)   Collection Time: 11/21/19  8:45 AM   Specimen: BLOOD RIGHT HAND  Result Value Ref Range Status   Specimen Description   Final    BLOOD RIGHT HAND Performed at [facility], [place] [address], [place], [place] [address]    Special Requests   Final    BOTTLES DRAWN AEROBIC ONLY Blood Culture adequate volume Performed at [place] [address] [place], [place] [address]    Culture   Final    NO GROWTH 2 DAYS Performed at [place] [address], [place], [place] [address]    Report Status PENDING  Incomplete         Radiology Studies: CT CHEST WO CONTRAST  Result Date: December 19, 2019 CLINICAL DATA:  Chest pain, shortness of breath. EXAM: CT CHEST WITHOUT CONTRAST TECHNIQUE: Multidetector CT imaging of the chest was performed following the standard protocol without IV contrast. COMPARISON:  Radiograph of same day. FINDINGS: Cardiovascular: Atherosclerosis of thoracic aorta is noted without aneurysm formation. Mild cardiomegaly is noted. No pericardial effusion is noted. Coronary artery calcifications are noted. Mediastinum/Nodes: Thyroid gland is unremarkable. No adenopathy is noted. Large sliding-type hiatal hernia is noted. Moderate dilatation of the more proximal esophagus is noted which is fluid-filled. Lungs/Pleura: No pneumothorax is noted. Small bilateral pleural effusions are noted with adjacent subsegmental atelectasis of both lower lobes. Mild biapical scarring  is noted. Upper Abdomen: No acute abnormality. Musculoskeletal: No chest wall mass or suspicious bone lesions identified. IMPRESSION: 1. Coronary artery calcifications are noted suggesting coronary artery disease. 2. Large sliding-type  hiatal hernia is noted. Moderate dilatation of the more proximal esophagus is noted which is fluid-filled. 3. Small bilateral pleural effusions are noted with adjacent subsegmental atelectasis of both lower lobes. 4. Aortic atherosclerosis. Aortic Atherosclerosis (ICD10-I70.0). Electronically Signed   By: Marijo Conception M.D.   On: Nov 28, 2019 10:34   DG Chest Port 1 View  Result Date: 2019-11-28 CLINICAL DATA:  Respiratory distress EXAM: PORTABLE CHEST 1 VIEW COMPARISON:  November 21, 2019 FINDINGS: Image rotated to the LEFT. Lung volumes are diminished since the previous exam. Blunting of LEFT costo diaphragmatic sulcus and partially obscured RIGHT hemidiaphragm. Patchy opacities in the chest, particularly RIGHT lower lobe and LEFT mid chest. Cardiomediastinal contours and hilar structures stable accounting for low lung volumes and rotated image. Extensive atherosclerotic CIS of the thoracic aorta and carotid arteries. Spinal degenerative changes and signs of prior LEFT humeral fracture. IMPRESSION: Diminished lung volumes with patchy opacities in the chest, particularly RIGHT lower lobe and LEFT mid chest. Findings could be related to multifocal pneumonia or atelectasis in the setting of developing effusions LEFT greater than RIGHT. Atherosclerosis. Aortic Atherosclerosis (ICD10-I70.0). Electronically Signed   By: Zetta Bills M.D.   On: 2019-11-28 08:40   DG CHEST PORT 1 VIEW  Result Date: 11/21/2019 CLINICAL DATA:  Fever EXAM: PORTABLE CHEST 1 VIEW COMPARISON:  11/29/2019 FINDINGS: Heart is normal size. No confluent opacities or effusions. Aortic atherosclerosis. NG tube is in the stomach. IMPRESSION: No acute cardiopulmonary disease. Electronically Signed   By: Rolm Baptise M.D.   On: 11/21/2019 17:00   DG Abd Portable 1V  Result Date: 11/28/19 CLINICAL DATA:  Respiratory distress.  Abdominal tenderness. EXAM: PORTABLE ABDOMEN - 1 VIEW COMPARISON:  11/22/2019.  11/20/2019.  CT 11/24/2019.  FINDINGS: Interim removal of NG tube. Previously identified oral contrast has passed. Interim improvement of small-bowel distention. Colonic gas pattern is normal. Stool noted throughout the colon. No free air. Severe degenerative changes lumbar spine. Degenerative changes both hips. IMPRESSION: Interim removal of NG tube. Previously identified oral contrast has passed. Interim improvement of small-bowel distention. Stool noted in the colon. Electronically Signed   By: Marcello Moores  Register   On: 11/28/2019 08:49   DG Abd Portable 1V  Result Date: 11/22/2019 CLINICAL DATA:  Small bowel obstruction EXAM: PORTABLE ABDOMEN - 1 VIEW COMPARISON:  Two days ago FINDINGS: The enteric tube tip is at the proximal stomach. Dilated small bowel in the left abdomen, up to 4 cm in diameter. Oral contrast has diffused from prior. The number of affected loops is diminished. No concerning mass effect or gas collection IMPRESSION: Partial normalization of small bowel distension. Electronically Signed   By: Monte Fantasia M.D.   On: 11/22/2019 05:37        Scheduled Meds: . aspirin  300 mg Rectal Daily  . Chlorhexidine Gluconate Cloth  6 each Topical Daily   Continuous Infusions: . cefTRIAXone (ROCEPHIN)  IV 2 g (November 28, 2019 1234)  . metronidazole Stopped (November 28, 2019 1209)  . potassium chloride 10 mEq (2019/11/28 1234)  . sodium chloride       LOS: 4 days    Time spent: 45 minutes    Barb Merino, MD [facility] Pager [phone]

## 2019-12-06 NOTE — Progress Notes (Signed)
  Echocardiogram 2D Echocardiogram has been performed.  Ashlee Cobb 2019-12-06, 3:25 PM

## 2019-12-06 NOTE — Progress Notes (Signed)
Sister at bedside.

## 2019-12-06 DEATH — deceased
